# Patient Record
Sex: Female | Born: 1990 | Race: White | Hispanic: No | Marital: Single | Smoking: Current every day smoker
Health system: Southern US, Community
[De-identification: ages and names within clinical notes are randomized; demographics above are authoritative.]

## PROBLEM LIST (undated history)

## (undated) DIAGNOSIS — E119 Type 2 diabetes mellitus without complications: Secondary | ICD-10-CM

## (undated) DIAGNOSIS — R41840 Attention and concentration deficit: Secondary | ICD-10-CM

## (undated) HISTORY — PX: OTHER SURGICAL HISTORY: SHX169

## (undated) HISTORY — PX: ABDOMINAL HYSTERECTOMY: SHX81

---

## 2020-07-09 ENCOUNTER — Encounter (HOSPITAL_COMMUNITY): Payer: Self-pay

## 2020-07-09 ENCOUNTER — Emergency Department (HOSPITAL_COMMUNITY)
Admission: EM | Admit: 2020-07-09 | Discharge: 2020-07-10 | Disposition: A | Discharge status: Home / Self Care | Payer: Medicaid Other | Source: Home / Self Care | Attending: Emergency Medicine | Admitting: Emergency Medicine

## 2020-07-09 DIAGNOSIS — Z5321 Procedure and treatment not carried out due to patient leaving prior to being seen by health care provider: Secondary | ICD-10-CM | POA: Insufficient documentation

## 2020-07-09 DIAGNOSIS — E1010 Type 1 diabetes mellitus with ketoacidosis without coma: Secondary | ICD-10-CM | POA: Insufficient documentation

## 2020-07-09 HISTORY — DX: Type 2 diabetes mellitus without complications: E11.9

## 2020-07-09 LAB — CBC
HCT: 46.3 % — ABNORMAL HIGH (ref 36.0–46.0)
Hemoglobin: 16.5 g/dL — ABNORMAL HIGH (ref 12.0–15.0)
MCH: 31.7 pg (ref 26.0–34.0)
MCHC: 35.6 g/dL (ref 30.0–36.0)
MCV: 89 fL (ref 80.0–100.0)
Platelets: 581 10*3/uL — ABNORMAL HIGH (ref 150–400)
RBC: 5.2 MIL/uL — ABNORMAL HIGH (ref 3.87–5.11)
RDW: 12.3 % (ref 11.5–15.5)
WBC: 16.4 10*3/uL — ABNORMAL HIGH (ref 4.0–10.5)
nRBC: 0 % (ref 0.0–0.2)

## 2020-07-09 LAB — CBG MONITORING, ED: Glucose-Capillary: 241 mg/dL — ABNORMAL HIGH (ref 70–99)

## 2020-07-09 NOTE — ED Triage Notes (Signed)
Pt reports that she has not been taking her insulin, just because, now she believes she is in DKA and she started to take it again two days, n/v

## 2020-07-10 ENCOUNTER — Other Ambulatory Visit: Payer: Self-pay

## 2020-07-10 ENCOUNTER — Encounter (HOSPITAL_COMMUNITY): Payer: Self-pay

## 2020-07-10 ENCOUNTER — Inpatient Hospital Stay (HOSPITAL_COMMUNITY)
Admission: EM | Admit: 2020-07-10 | Discharge: 2020-07-12 | DRG: 638 | Disposition: A | Discharge status: Home / Self Care | Payer: Medicaid Other | Attending: Internal Medicine | Admitting: Internal Medicine

## 2020-07-10 ENCOUNTER — Encounter (HOSPITAL_COMMUNITY): Payer: Self-pay | Admitting: Emergency Medicine

## 2020-07-10 ENCOUNTER — Emergency Department (HOSPITAL_COMMUNITY): Payer: Medicaid Other

## 2020-07-10 DIAGNOSIS — E1310 Other specified diabetes mellitus with ketoacidosis without coma: Secondary | ICD-10-CM | POA: Diagnosis not present

## 2020-07-10 DIAGNOSIS — E860 Dehydration: Secondary | ICD-10-CM | POA: Diagnosis present

## 2020-07-10 DIAGNOSIS — R7989 Other specified abnormal findings of blood chemistry: Secondary | ICD-10-CM | POA: Diagnosis present

## 2020-07-10 DIAGNOSIS — E876 Hypokalemia: Secondary | ICD-10-CM | POA: Clinically undetermined

## 2020-07-10 DIAGNOSIS — Z79899 Other long term (current) drug therapy: Secondary | ICD-10-CM | POA: Diagnosis not present

## 2020-07-10 DIAGNOSIS — Z20822 Contact with and (suspected) exposure to covid-19: Secondary | ICD-10-CM | POA: Diagnosis present

## 2020-07-10 DIAGNOSIS — D72828 Other elevated white blood cell count: Secondary | ICD-10-CM | POA: Diagnosis present

## 2020-07-10 DIAGNOSIS — Z794 Long term (current) use of insulin: Secondary | ICD-10-CM | POA: Diagnosis not present

## 2020-07-10 DIAGNOSIS — R8281 Pyuria: Secondary | ICD-10-CM | POA: Diagnosis present

## 2020-07-10 DIAGNOSIS — Z91048 Other nonmedicinal substance allergy status: Secondary | ICD-10-CM

## 2020-07-10 DIAGNOSIS — Z9109 Other allergy status, other than to drugs and biological substances: Secondary | ICD-10-CM

## 2020-07-10 DIAGNOSIS — N179 Acute kidney failure, unspecified: Secondary | ICD-10-CM | POA: Diagnosis present

## 2020-07-10 DIAGNOSIS — E1010 Type 1 diabetes mellitus with ketoacidosis without coma: Principal | ICD-10-CM | POA: Diagnosis present

## 2020-07-10 DIAGNOSIS — R000 Tachycardia, unspecified: Secondary | ICD-10-CM | POA: Diagnosis present

## 2020-07-10 DIAGNOSIS — Z9119 Patient's noncompliance with other medical treatment and regimen: Secondary | ICD-10-CM | POA: Diagnosis not present

## 2020-07-10 DIAGNOSIS — D72829 Elevated white blood cell count, unspecified: Secondary | ICD-10-CM | POA: Diagnosis not present

## 2020-07-10 HISTORY — DX: Attention and concentration deficit: R41.840

## 2020-07-10 LAB — COMPREHENSIVE METABOLIC PANEL
ALT: 22 U/L (ref 0–44)
AST: 28 U/L (ref 15–41)
Albumin: 5.1 g/dL — ABNORMAL HIGH (ref 3.5–5.0)
Alkaline Phosphatase: 72 U/L (ref 38–126)
Anion gap: 20 — ABNORMAL HIGH (ref 5–15)
BUN: 38 mg/dL — ABNORMAL HIGH (ref 6–20)
CO2: 23 mmol/L (ref 22–32)
Calcium: 10 mg/dL (ref 8.9–10.3)
Chloride: 100 mmol/L (ref 98–111)
Creatinine, Ser: 1.07 mg/dL — ABNORMAL HIGH (ref 0.44–1.00)
GFR, Estimated: >60 mL/min (ref >60)
Glucose, Bld: 72 mg/dL (ref 70–99)
Potassium: 3.4 mmol/L — ABNORMAL LOW (ref 3.5–5.1)
Sodium: 143 mmol/L (ref 135–145)
Total Bilirubin: 1.3 mg/dL — ABNORMAL HIGH (ref 0.3–1.2)
Total Protein: 9.2 g/dL — ABNORMAL HIGH (ref 6.5–8.1)

## 2020-07-10 LAB — BASIC METABOLIC PANEL
Anion gap: 22 — ABNORMAL HIGH (ref 5–15)
BUN: 26 mg/dL — ABNORMAL HIGH (ref 6–20)
CO2: 17 mmol/L — ABNORMAL LOW (ref 22–32)
Calcium: 10 mg/dL (ref 8.9–10.3)
Chloride: 100 mmol/L (ref 98–111)
Creatinine, Ser: 1.25 mg/dL — ABNORMAL HIGH (ref 0.44–1.00)
GFR, Estimated: 59 mL/min — ABNORMAL LOW (ref >60)
Glucose, Bld: 230 mg/dL — ABNORMAL HIGH (ref 70–99)
Potassium: 3.6 mmol/L (ref 3.5–5.1)
Sodium: 139 mmol/L (ref 135–145)

## 2020-07-10 LAB — URINALYSIS, ROUTINE W REFLEX MICROSCOPIC
Bilirubin Urine: NEGATIVE
Glucose, UA: NEGATIVE mg/dL
Ketones, ur: 80 mg/dL — AB
Leukocytes,Ua: NEGATIVE
Nitrite: NEGATIVE
Protein, ur: 100 mg/dL — AB
Specific Gravity, Urine: 1.019 (ref 1.005–1.030)
WBC, UA: >50 WBC/hpf — ABNORMAL HIGH (ref 0–5)
pH: 6 (ref 5.0–8.0)

## 2020-07-10 LAB — CBC WITH DIFFERENTIAL/PLATELET
Abs Immature Granulocytes: 0.08 10*3/uL — ABNORMAL HIGH (ref 0.00–0.07)
Basophils Absolute: 0 10*3/uL (ref 0.0–0.1)
Basophils Relative: 0 %
Eosinophils Absolute: 0 10*3/uL (ref 0.0–0.5)
Eosinophils Relative: 0 %
HCT: 47.4 % — ABNORMAL HIGH (ref 36.0–46.0)
Hemoglobin: 17.4 g/dL — ABNORMAL HIGH (ref 12.0–15.0)
Immature Granulocytes: 0 %
Lymphocytes Relative: 8 %
Lymphs Abs: 1.7 10*3/uL (ref 0.7–4.0)
MCH: 31.9 pg (ref 26.0–34.0)
MCHC: 36.7 g/dL — ABNORMAL HIGH (ref 30.0–36.0)
MCV: 87 fL (ref 80.0–100.0)
Monocytes Absolute: 1.6 10*3/uL — ABNORMAL HIGH (ref 0.1–1.0)
Monocytes Relative: 8 %
Neutro Abs: 17.5 10*3/uL — ABNORMAL HIGH (ref 1.7–7.7)
Neutrophils Relative %: 84 %
Platelets: 501 10*3/uL — ABNORMAL HIGH (ref 150–400)
RBC: 5.45 MIL/uL — ABNORMAL HIGH (ref 3.87–5.11)
RDW: 12.3 % (ref 11.5–15.5)
WBC: 20.9 10*3/uL — ABNORMAL HIGH (ref 4.0–10.5)
nRBC: 0 % (ref 0.0–0.2)

## 2020-07-10 LAB — RESPIRATORY PANEL BY RT PCR (FLU A&B, COVID)
Influenza A by PCR: NEGATIVE
Influenza B by PCR: NEGATIVE
SARS Coronavirus 2 by RT PCR: NEGATIVE

## 2020-07-10 LAB — BLOOD GAS, VENOUS
Acid-Base Excess: 4.6 mmol/L — ABNORMAL HIGH (ref 0.0–2.0)
Bicarbonate: 26.9 mmol/L (ref 20.0–28.0)
O2 Saturation: 95.5 %
Patient temperature: 98.6
pCO2, Ven: 33.7 mmHg — ABNORMAL LOW (ref 44.0–60.0)
pH, Ven: 7.513 — ABNORMAL HIGH (ref 7.250–7.430)
pO2, Ven: 71.1 mmHg — ABNORMAL HIGH (ref 32.0–45.0)

## 2020-07-10 LAB — I-STAT BETA HCG BLOOD, ED (MC, WL, AP ONLY): I-stat hCG, quantitative: <5 m[IU]/mL (ref <5)

## 2020-07-10 LAB — CBG MONITORING, ED
Glucose-Capillary: 112 mg/dL — ABNORMAL HIGH (ref 70–99)
Glucose-Capillary: 125 mg/dL — ABNORMAL HIGH (ref 70–99)
Glucose-Capillary: 149 mg/dL — ABNORMAL HIGH (ref 70–99)
Glucose-Capillary: 87 mg/dL (ref 70–99)

## 2020-07-10 MED ORDER — LACTATED RINGERS IV SOLN: INTRAVENOUS | Status: DC

## 2020-07-10 MED ORDER — DEXTROSE IN LACTATED RINGERS 5 % IV SOLN: INTRAVENOUS | Status: DC

## 2020-07-10 MED ORDER — DEXTROSE 50 % IV SOLN: 0.0000 mL | INTRAVENOUS | Status: DC | PRN

## 2020-07-10 MED ORDER — LACTATED RINGERS IV BOLUS: 20.0000 mL/kg | Freq: Once | INTRAVENOUS | Status: DC

## 2020-07-10 MED ORDER — ONDANSETRON HCL 4 MG/2ML IJ SOLN
4.0000 mg | Freq: Once | INTRAMUSCULAR | Status: AC
  Administered 2020-07-10: 4 mg via INTRAVENOUS
  Filled 2020-07-10: qty 2

## 2020-07-10 MED ORDER — ENOXAPARIN SODIUM 40 MG/0.4ML ~~LOC~~ SOLN
40.0000 mg | SUBCUTANEOUS | Status: DC
  Filled 2020-07-10: qty 0.4

## 2020-07-10 MED ORDER — LACTATED RINGERS IV BOLUS
1000.0000 mL | Freq: Once | INTRAVENOUS | Status: AC
  Administered 2020-07-10: 1000 mL via INTRAVENOUS

## 2020-07-10 MED ORDER — INSULIN REGULAR(HUMAN) IN NACL 100-0.9 UT/100ML-% IV SOLN
INTRAVENOUS | Status: DC
  Administered 2020-07-11: 0.9 [IU]/h via INTRAVENOUS
  Filled 2020-07-10: qty 100

## 2020-07-10 MED ORDER — INSULIN REGULAR(HUMAN) IN NACL 100-0.9 UT/100ML-% IV SOLN: INTRAVENOUS | Status: DC

## 2020-07-10 MED ORDER — LORAZEPAM 2 MG/ML IJ SOLN
1.0000 mg | Freq: Once | INTRAMUSCULAR | Status: AC
  Administered 2020-07-10: 1 mg via INTRAVENOUS
  Filled 2020-07-10: qty 1

## 2020-07-10 MED ORDER — LACTATED RINGERS IV BOLUS
20.0000 mL/kg | Freq: Once | INTRAVENOUS | Status: AC
  Administered 2020-07-10: 1440 mL via INTRAVENOUS

## 2020-07-10 MED ORDER — POTASSIUM CHLORIDE 10 MEQ/100ML IV SOLN
10.0000 meq | INTRAVENOUS | Status: AC
  Administered 2020-07-11 (×3): 10 meq via INTRAVENOUS
  Filled 2020-07-10 (×4): qty 100

## 2020-07-10 MED ORDER — ONDANSETRON HCL 4 MG/2ML IJ SOLN: 4.0000 mg | Freq: Once | INTRAMUSCULAR | Status: DC

## 2020-07-10 MED ORDER — SODIUM CHLORIDE 0.9 % IV BOLUS
1000.0000 mL | Freq: Once | INTRAVENOUS | Status: AC
  Administered 2020-07-10: 1000 mL via INTRAVENOUS

## 2020-07-10 NOTE — ED Notes (Signed)
NAX3

## 2020-07-10 NOTE — H&P (Addendum)
History and Physical    Emily Acevedo:[id number] DOB: 07/31/1991 DOA: 07/10/2020  PCP: Patient, No Pcp Per  Patient coming from: Home  I have personally briefly reviewed patient's old medical records in [facility]  Chief Complaint: DM, ?DKA  HPI: Emily Acevedo is a 29 y.o. female with medical history significant of DM1, hysterectomy.  Pt presents to the ED via EMS with concern for DKA.  Pt has been out of diabetic test strips for past 1 year, not monitoring BGLs at home.  Over past 3 days she has been feeling unwell with nausea and SOB.  Feels like DKA.  Treating symptoms with 5-10u of insulin per hour for the past 2 days she says.  Administered self 15u just prior to calling EMS this afternoon.  On EMS arrival CBG 160, was 126 on recheck in ambulance.  Usually takes 1u per 5 carbs.  Has endocrinologist first appointment next month.  Denies fevers, chills, dysuria, foot ulcers, abd pain.   ED Course: Pt does have mild DKA with AG of 20 and 80 keytones in urine.  PH not acidotic however.  Bicarb 23.  Has WBC 20.9k.  Has very mild AKI / pre-renal azotemia with BUN 38 and creat 1.0.  HGB 17.4 (suspect hemoconcentration).  K 3.4.  BGL 72.  UA with 80 keytones, >50 WBC, no LE nor nitrites.   Review of Systems: As per HPI, otherwise all review of systems negative.  Past Medical History:  Diagnosis Date  . Diabetes mellitus without complication [facility])     Past Surgical History:  Procedure Laterality Date  . ABDOMINAL HYSTERECTOMY       reports that she has been smoking e-cigarettes. She does not have any smokeless tobacco history on file. She reports that she does not drink alcohol and does not use drugs.  Allergies  Allergen Reactions  . Other     Pt states allergy to base metals, sever rash  . Tape     Pt states "it tears my skin open"  . Nickel Rash    Family History  Problem Relation Age of Onset  . Diabetes Neg Hx      Prior to Admission  medications   Medication Sig Start Date End Date Taking? Authorizing Provider  gabapentin (NEURONTIN) 300 MG capsule Take 300 mg by mouth daily. 07/03/20  Yes [provider]  insulin aspart (NOVOLOG) 100 UNIT/ML FlexPen Inject 1 Units into the skin See admin instructions. Inject 1 unit per every carb intake. 08/24/19  Yes [provider]  insulin glargine (LANTUS SOLOSTAR) 100 UNIT/ML Solostar Pen Inject 38 Units into the skin daily. 12/16/17  Yes [provider]  ondansetron (ZOFRAN-ODT) 4 MG disintegrating tablet Take 4 mg by mouth every 8 (eight) hours as needed for nausea or vomiting.  08/24/19  Yes [provider]    Physical Exam: Vitals:   07/10/20 2115 07/10/20 2200 07/10/20 2227 07/10/20 2324  BP:  (!) 152/93  (!) 139/92  Pulse:    (!) 119  Resp: 17 18  (!) 21  Temp:   98 F (36.7 C) 98.8 F (37.1 C)  TempSrc:    Oral  SpO2:    95%  Weight:      Height:        Constitutional: NAD, ill appearing Eyes: PERRL, lids and conjunctivae normal ENMT: Mucous membranes are moist. Posterior pharynx clear of any exudate or lesions.Normal dentition.  Neck: normal, supple, no masses, no thyromegaly Respiratory: clear  to auscultation bilaterally, no wheezing, no crackles. Normal respiratory effort. No accessory muscle use.  Cardiovascular: Tachycardic Abdomen: no tenderness, no masses palpated. No hepatosplenomegaly. Bowel sounds positive.  Musculoskeletal: no clubbing / cyanosis. No joint deformity upper and lower extremities. Good ROM, no contractures. Normal muscle tone.  Skin: no rashes, lesions, ulcers. No induration Neurologic: CN 2-12 grossly intact. Sensation intact, DTR normal. Strength 5/5 in all 4.  Psychiatric: Normal judgment and insight. Alert and oriented x 3. Normal mood.    Labs on Admission: I have personally reviewed following labs and imaging studies  CBC: Recent Labs  Lab 07/09/20 2334 07/10/20 1540  WBC 16.4* 20.9*    NEUTROABS  --  17.5*  HGB 16.5* 17.4*  HCT 46.3* 47.4*  MCV 89.0 87.0  PLT 581* 501*   Basic Metabolic Panel: Recent Labs  Lab 07/09/20 2334 07/10/20 1540  NA 139 143  K 3.6 3.4*  CL 100 100  CO2 17* 23  GLUCOSE 230* 72  BUN 26* 38*  CREATININE 1.25* 1.07*  CALCIUM 10.0 10.0   GFR: Estimated Creatinine Clearance: 77.6 mL/min (A) (by C-G formula based on SCr of 1.07 mg/dL (H)). Liver Function Tests: Recent Labs  Lab 07/10/20 1540  AST 28  ALT 22  ALKPHOS 72  BILITOT 1.3*  PROT 9.2*  ALBUMIN 5.1*   No results for input(s): LIPASE, AMYLASE in the last 168 hours. No results for input(s): AMMONIA in the last 168 hours. Coagulation Profile: No results for input(s): INR, PROTIME in the last 168 hours. Cardiac Enzymes: No results for input(s): CKTOTAL, CKMB, CKMBINDEX, TROPONINI in the last 168 hours. BNP (last 3 results) No results for input(s): PROBNP in the last 8760 hours. HbA1C: No results for input(s): HGBA1C in the last 72 hours. CBG: Recent Labs  Lab 07/09/20 2307 07/10/20 1537 07/10/20 1622 07/10/20 1748  GLUCAP 241* 87 149* 112*   Lipid Profile: No results for input(s): CHOL, HDL, LDLCALC, TRIG, CHOLHDL, LDLDIRECT in the last 72 hours. Thyroid Function Tests: No results for input(s): TSH, T4TOTAL, FREET4, T3FREE, THYROIDAB in the last 72 hours. Anemia Panel: No results for input(s): VITAMINB12, FOLATE, FERRITIN, TIBC, IRON, RETICCTPCT in the last 72 hours. Urine analysis:    Component Value Date/Time   COLORURINE YELLOW 07/10/2020 2158   APPEARANCEUR CLOUDY (A) 07/10/2020 2158   LABSPEC 1.019 07/10/2020 2158   PHURINE 6.0 07/10/2020 2158   GLUCOSEU NEGATIVE 07/10/2020 2158   HGBUR SMALL (A) 07/10/2020 2158   BILIRUBINUR NEGATIVE 07/10/2020 2158   KETONESUR 80 (A) 07/10/2020 2158   PROTEINUR 100 (A) 07/10/2020 2158   NITRITE NEGATIVE 07/10/2020 2158   LEUKOCYTESUR NEGATIVE 07/10/2020 2158    Radiological Exams on Admission: DG Chest Port 1  View  Result Date: 07/10/2020 CLINICAL DATA:  Leukocytosis EXAM: PORTABLE CHEST 1 VIEW COMPARISON:  None. FINDINGS: The heart size and mediastinal contours are within normal limits. Both lungs are clear. The visualized skeletal structures are unremarkable. IMPRESSION: No active disease. Electronically Signed   By: Jasmine Pang M.D.   On: 07/10/2020 23:25    EKG: Independently reviewed.  Assessment/Plan Principal Problem:   DKA, type 1 (HCC) Active Problems:   Leukocytosis   Pyuria    1. DKA type 1 - 1. DKA pathway 2. Insulin gtt 3. IVF bolus and continuous per pathway 4. BGL not very elevated thanks to her taking insulin at home, but now needs glucose + insulin to feed cells until ketosis resolves. 5. BMP Q4H 2. Leukocytosis - 1. Possibly just stress  related demargination?  No obvious source or symptoms of infection at this point. 2. Repeat CBC in AM 3. See if tachycardia resolves with IVF bolus 3. Pyuria - 1. No dysuria symptoms, no leukocyte esterase 2. ?Mild ATN? 3. Check UCx  DVT prophylaxis: Lovenox Code Status: Full Family Communication: No family in room Disposition Plan: Home after DKA resolved Consults called: None Admission status: Admit to inpatient  Severity of Illness: The appropriate patient status for this patient is INPATIENT. Inpatient status is judged to be reasonable and necessary in order to provide the required intensity of service to ensure the patient's safety. The patient's presenting symptoms, physical exam findings, and initial radiographic and laboratory data in the context of their chronic comorbidities is felt to place them at high risk for further clinical deterioration. Furthermore, it is not anticipated that the patient will be medically stable for discharge from the hospital within 2 midnights of admission. The following factors support the patient status of inpatient.   IP status for treatment of DKA.   * I certify that at the point of  admission it is my clinical judgment that the patient will require inpatient hospital care spanning beyond 2 midnights from the point of admission due to high intensity of service, high risk for further deterioration and high frequency of surveillance required.*    September Mormile M. DO [facility]  How to contact the [facility] Attending or Consulting provider 7A - 7P or covering provider during after hours 7P -7A, for this patient?  1. Check the care team in [facility] and look for a) attending/consulting [facility] provider listed and b) the [facility] team listed 2. Log into [email or web address]  Amion Physician Scheduling and messaging for groups and whole hospitals  On call and physician scheduling software for group practices, residents, hospitalists and other medical providers for call, clinic, rotation and shift schedules. OnCall [employer] is a hospital-wide system for scheduling doctors and paging doctors on call. EasyPlot is for scientific plotting and data analysis.  [email or web address]  and use [place]'s universal password to access. If you do not have the password, please contact the hospital operator.  3. Locate the [facility] provider you are looking for under [facility] and page to a number that you can be directly reached. 4. If you still have difficulty reaching the provider, please page the [facility] (Director on Call) for the Hospitalists listed on amion for assistance.  07/10/2020, 11:39 PM

## 2020-07-10 NOTE — ED Notes (Addendum)
Pt placed on purewick at . Pt requests juice at this time. Pt notes she is allergic to adhesive. Pt is using the pulseox from fast track 6.

## 2020-07-10 NOTE — ED Provider Notes (Signed)
Emily Acevedo Provider Note   CSN: [id number] Arrival date & time: 07/10/20  1505     History Chief Complaint  Patient presents with  . Diabetes    Emily Acevedo is a 29 y.o. female w PMHx T1DM, hysterectomy, presenting to the ED via EMS with concern for DKA.  Patient states she is a type I diabetic, has been out of test strips for 1 year and has not been monitoring her blood sugars.  She states over the last 3 days she has been feeling unwell with nausea and shortness of breath.  She feels as though she is in DKA.  She states she has been treating her symptoms with anywhere from 5 to 10 units of insulin near every hour for the last 2 days.  She states she administered 15 units just prior to calling EMS this afternoon.  On EMS arrival to her house CBG was 160.  On recheck in the ambulance CBG was 126.  Denies fevers or chills, denies abdominal pain, cough, or other associated symptoms.  Patient states she treats her symptoms with insulin, takes anywhere from 5 to 10 units depending on how she feels.  She also takes 1 unit for every 5 carbs.  She has established care with an endocrinologist, her first appointment is next month.  She does not have a PCP.  The history is provided by the patient.       Past Medical History:  Diagnosis Date  . Diabetes mellitus without complication [facility])     Patient Active Problem List   Diagnosis Date Noted  . DKA, type 1 (HCC) 07/10/2020    Past Surgical History:  Procedure Laterality Date  . ABDOMINAL HYSTERECTOMY       OB History   No obstetric history on file.     History reviewed. No pertinent family history.  Social History   Tobacco Use  . Smoking status: Not on file  Substance Use Topics  . Alcohol use: Not on file  . Drug use: Not on file    Home Medications Prior to Admission medications   Medication Sig Start Date End Date Taking? Authorizing Provider  gabapentin (NEURONTIN) 300 MG  capsule Take 300 mg by mouth daily. 07/03/20  Yes [provider]  insulin aspart (NOVOLOG) 100 UNIT/ML FlexPen Inject 1 Units into the skin See admin instructions. Inject 1 unit per every carb intake. 08/24/19  Yes [provider]  insulin glargine (LANTUS SOLOSTAR) 100 UNIT/ML Solostar Pen Inject 38 Units into the skin daily. 12/16/17  Yes [provider]  ondansetron (ZOFRAN-ODT) 4 MG disintegrating tablet Take 4 mg by mouth every 8 (eight) hours as needed for nausea or vomiting.  08/24/19  Yes [provider]    Allergies    Other, Tape, and Nickel  Review of Systems   Review of Systems  All other systems reviewed and are negative.   Physical Exam Updated Vital Signs BP (!) 139/92 (BP Location: Left Arm)   Pulse (!) 119   Temp 98.8 F (37.1 C) (Oral)   Resp (!) 21   Ht 5\' 8"  (1.727 m)   Wt 72 kg   SpO2 95%   BMI 24.14 kg/m   Physical Exam Vitals and nursing note reviewed.  Constitutional:      Appearance: She is well-developed. She is ill-appearing.  HENT:     Head: Normocephalic and atraumatic.  Eyes:     Conjunctiva/sclera: Conjunctivae normal.  Cardiovascular:  Rate and Rhythm: Regular rhythm. Tachycardia present.  Pulmonary:     Effort: Pulmonary effort is normal. No respiratory distress.     Breath sounds: Normal breath sounds.     Comments: Normal work of breathing, no tachypnea Abdominal:     General: Bowel sounds are normal.     Palpations: Abdomen is soft.     Tenderness: There is no abdominal tenderness.  Skin:    General: Skin is warm.  Neurological:     Mental Status: She is alert.  Psychiatric:        Behavior: Behavior normal.     ED Results / Procedures / Treatments   Labs (all labs ordered are listed, but only abnormal results are displayed) Labs Reviewed  CBC WITH DIFFERENTIAL/PLATELET - Abnormal; Notable for the following components:      Result Value   WBC 20.9 (*)    RBC 5.45 (*)    Hemoglobin  17.4 (*)    HCT 47.4 (*)    MCHC 36.7 (*)    Platelets 501 (*)    Neutro Abs 17.5 (*)    Monocytes Absolute 1.6 (*)    Abs Immature Granulocytes 0.08 (*)    All other components within normal limits  COMPREHENSIVE METABOLIC PANEL - Abnormal; Notable for the following components:   Potassium 3.4 (*)    BUN 38 (*)    Creatinine, Ser 1.07 (*)    Total Protein 9.2 (*)    Albumin 5.1 (*)    Total Bilirubin 1.3 (*)    Anion gap 20 (*)    All other components within normal limits  URINALYSIS, ROUTINE W REFLEX MICROSCOPIC - Abnormal; Notable for the following components:   APPearance CLOUDY (*)    Hgb urine dipstick SMALL (*)    Ketones, ur 80 (*)    Protein, ur 100 (*)    WBC, UA >50 (*)    Bacteria, UA MANY (*)    All other components within normal limits  BLOOD GAS, VENOUS - Abnormal; Notable for the following components:   pH, Ven 7.513 (*)    pCO2, Ven 33.7 (*)    pO2, Ven 71.1 (*)    Acid-Base Excess 4.6 (*)    All other components within normal limits  CBG MONITORING, ED - Abnormal; Notable for the following components:   Glucose-Capillary 149 (*)    All other components within normal limits  CBG MONITORING, ED - Abnormal; Notable for the following components:   Glucose-Capillary 112 (*)    All other components within normal limits  RESPIRATORY PANEL BY RT PCR (FLU A&B, COVID)  HIV ANTIBODY (ROUTINE TESTING W REFLEX)  BASIC METABOLIC PANEL  BASIC METABOLIC PANEL  BASIC METABOLIC PANEL  BASIC METABOLIC PANEL  BETA-HYDROXYBUTYRIC ACID  BETA-HYDROXYBUTYRIC ACID  HEMOGLOBIN A1C  CBG MONITORING, ED  I-STAT VENOUS BLOOD GAS, ED    EKG EKG Interpretation  Date/Time:  Thursday July 10 2020 18:31:02 EDT Ventricular Rate:  119 PR Interval:    QRS Duration: 86 QT Interval:  291 QTC Calculation: 410 R Axis:   71 Text Interpretation: Sinus tachycardia Borderline repolarization abnormality since last tracing no significant change Confirmed by Mancel Bale [phone]) on  07/10/2020 6:36:49 PM   Radiology DG Chest Port 1 View  Result Date: 07/10/2020 CLINICAL DATA:  Leukocytosis EXAM: PORTABLE CHEST 1 VIEW COMPARISON:  None. FINDINGS: The heart size and mediastinal contours are within normal limits. Both lungs are clear. The visualized skeletal structures are unremarkable. IMPRESSION: No active disease. Electronically  Signed   By: Jasmine Pang M.D.   On: 07/10/2020 23:25    Procedures .Critical Care Performed by: Katiejo Gilroy, [place] N, PA-C Authorized by: Rubi Tooley, [place] N, PA-C   Critical care provider statement:    Critical care time (minutes):  45   Critical care time was exclusive of:  Separately billable procedures and treating other patients and teaching time   Critical care was necessary to treat or prevent imminent or life-threatening deterioration of the following conditions:  Metabolic crisis   Critical care was time spent personally by me on the following activities:  Discussions with consultants, evaluation of patient's response to treatment, examination of patient, ordering and performing treatments and interventions, ordering and review of laboratory studies, ordering and review of radiographic studies, pulse oximetry, re-evaluation of patient's condition, obtaining history from patient or surrogate and review of old charts   I assumed direction of critical care for this patient from another provider in my specialty: no     (including critical care time)  Medications Ordered in ED Medications  enoxaparin (LOVENOX) injection 40 mg (has no administration in time range)  lactated ringers bolus 1,440 mL (has no administration in time range)  insulin regular, human (MYXREDLIN) 100 units/ 100 mL infusion (has no administration in time range)  lactated ringers infusion (has no administration in time range)  dextrose 5 % in lactated ringers infusion (has no administration in time range)  dextrose 50 % solution 0-50 mL (has no administration in time  range)  potassium chloride 10 mEq in 100 mL IVPB (has no administration in time range)  sodium chloride 0.9 % bolus 1,000 mL (0 mLs Intravenous Stopped 07/10/20 1759)  lactated ringers bolus 1,000 mL (0 mLs Intravenous Stopped 07/10/20 2307)  ondansetron (ZOFRAN) injection 4 mg (4 mg Intravenous Given 07/10/20 2029)  LORazepam (ATIVAN) injection 1 mg (1 mg Intravenous Given 07/10/20 2226)    ED Course  I have reviewed the triage vital signs and the nursing notes.  Pertinent labs & imaging results that were available during my care of the patient were reviewed by me and considered in my medical decision making (see chart for details).  Clinical Course as of Jul 10 2333  Thu Jul 10, 2020  2015 Patient reevaluated, reports nausea is returning.  Discussed laboratory work-up thus far, plan for VBG.  We will continue IV hydration.   [JR]  2159 Patient reevaluated once more, reports nausea is not improved.  She does endorse daily marijuana use to help stimulate appetite.  She denies urinary symptoms, will attempt to provide urine sample at bedside.  She states she feels too weak to ambulate to the restroom.   [JR]    Clinical Course User Index [JR] Kalyssa Anker, [place] N, PA-C   MDM Rules/Calculators/A&P                          Pt with T1DM, noncompliant with management over the last year, presenting with symptoms of DKA over the last few days. She endorses nausea and generalized weakness. She has treated with insulin at home over the last 2 days, presented yesterday and had labs drawn but left after triage without being seen. Labs yesterday show mild DKA. She treated with 15u insulin today just prior to arrival.   On arrival, patient is ill-appearing, vital signs without fever though she is tachycardic slightly hypertensive.  CBG is 87 on arrival, down to 100 from 160 per EMS.  Labs ordered, IV  fluids.  Metabolic panel with collated BUN of 38, creatinine 1.1.  Bicarb is 23 with an elevated gap  of 20.  He is also noted to have a leukocytosis of 20.9, up from yesterday with elevated hemoglobin.  Findings are consistent with dehydration, however given patient's history and presentation, VBG ordered to evaluate pH in consideration of compensated DKA.  pH shows alkalosis at 7.5, bicarb of 27, PCO2 34. Suspect some compensation since yesterday. U/A with ketones, contaminated specimen with 6-10 squamous - no symptoms of UTI, will hold on abx. Pt agreeable with this plan.  Patient without much symptoms improvement overall after multiple liters of IVF. She does feel some improvement in nausea after ativan, suspect this was difficult to manage due to daily marijuana use. Patient admitted for dehydration with diabetic ketosis in the setting of compensated DKA.   Final Clinical Impression(s) / ED Diagnoses Final diagnoses:  Dehydration  Diabetic ketosis [facility](HCC)    Rx / DC Orders ED Discharge Orders    None       Cheryn Lundquist, [place] N, PA-C 07/10/20 2334    Mancel BaleWentz, [place], MD 07/12/20 [phone]

## 2020-07-10 NOTE — ED Notes (Signed)
Pt tolerated of orange juice

## 2020-07-10 NOTE — ED Notes (Signed)
ED TO INPATIENT HANDOFF REPORT  ED Nurse Name and Phone #: Sharene Skeans [phone]  S Name/Age/Gender Emily Acevedo 29 y.o. female Room/Bed: WA21/WA21  Code Status   Code Status: Full Code  Home/SNF/Other Home Patient oriented to: self, place,time, situation Is this baseline? Yes   Triage Complete: Triage complete  Chief Complaint DKA, type 1 (HCC) [E10.10]  Triage Note Pt states hasn't been checking BS due to out of strips. EMS reports cbg-160 on arrival and cbg-126 in route. Patient self administered 15 units of novolog while calling ems. Pt reports n/v/abd pain/weakness/lightheaded. 4mg  of Zofran and NS admin with EMS    Allergies Allergies  Allergen Reactions  . Other     Pt states allergy to base metals, sever rash  . Tape     Pt states "it tears my skin open"  . Nickel Rash    Level of Care/Admitting Diagnosis ED Disposition    ED Disposition Condition Comment   Admit  Hospital Area: [facility] [[id number]]  Level of Care: Stepdown [14]  Admit to SDU based on following criteria: Severe physiological/psychological symptoms:  Any diagnosis requiring assessment & intervention at least every 4 hours on an ongoing basis to obtain desired patient outcomes including stability and rehabilitation  May admit patient to [facility] if equivalent level of care is available:: No  Covid Evaluation: Asymptomatic Screening Protocol (No Symptoms)  Diagnosis: DKA, type 1 [facility]) [facility]  Admitting Physician: [[id number]]  Attending Physician: Wyvonnia Dusky [phone]  Estimated length of stay: past midnight tomorrow  Certification:: I certify this patient will need inpatient services for at least 2 midnights       B Medical/Surgery History Past Medical History:  Diagnosis Date  . Diabetes mellitus without complication [facility])    Past Surgical History:  Procedure Laterality Date  . ABDOMINAL HYSTERECTOMY       A IV  Location/Drains/Wounds Patient Lines/Drains/Airways Status    Active Line/Drains/Airways    Name Placement date Placement time Site Days   Peripheral IV 07/10/20 Left Hand 07/10/20  2055  Hand  less than 1          Intake/Output Last 24 hours  Intake/Output Summary (Last 24 hours) at 07/10/2020 2324 Last data filed at 07/10/2020 1759 Gross per 24 hour  Intake 1000 ml  Output --  Net 1000 ml    Labs/Imaging Results for orders placed or performed during the hospital encounter of 07/10/20 (from the past 48 hour(s))  CBG monitoring, ED     Status: None   Collection Time: 07/10/20  3:37 PM  Result Value Ref Range   Glucose-Capillary 87 70 - 99 mg/dL    Comment: Glucose reference range applies only to samples taken after fasting for at least 8 hours.  CBC with Differential     Status: Abnormal   Collection Time: 07/10/20  3:40 PM  Result Value Ref Range   WBC 20.9 (H) 4.0 - 10.5 K/uL   RBC 5.45 (H) 3.87 - 5.11 MIL/uL   Hemoglobin 17.4 (H) 12.0 - 15.0 g/dL   HCT 07/12/20 (H) 36 - 46 %   MCV 87.0 80.0 - 100.0 fL   MCH 31.9 26.0 - 34.0 pg   MCHC 36.7 (H) 30.0 - 36.0 g/dL   RDW 73.2 20.2 - 54.2 %   Platelets 501 (H) 150 - 400 K/uL   nRBC 0.0 0.0 - 0.2 %   Neutrophils Relative % 84 %  Neutro Abs 17.5 (H) 1.7 - 7.7 K/uL   Lymphocytes Relative 8 %   Lymphs Abs 1.7 0.7 - 4.0 K/uL   Monocytes Relative 8 %   Monocytes Absolute 1.6 (H) 0.1 - 1.0 K/uL   Eosinophils Relative 0 %   Eosinophils Absolute 0.0 0.0 - 0.5 K/uL   Basophils Relative 0 %   Basophils Absolute 0.0 0.0 - 0.1 K/uL   Immature Granulocytes 0 %   Abs Immature Granulocytes 0.08 (H) 0.00 - 0.07 K/uL    Comment: Performed at [facility], [address] [place], [place] [address]  Comprehensive metabolic panel     Status: Abnormal   Collection Time: 07/10/20  3:40 PM  Result Value Ref Range   Sodium 143 135 - 145 mmol/L   Potassium 3.4 (L) 3.5 - 5.1 mmol/L   Chloride 100 98 - 111 mmol/L   CO2 23  22 - 32 mmol/L   Glucose, Bld 72 70 - 99 mg/dL    Comment: Glucose reference range applies only to samples taken after fasting for at least 8 hours.   BUN 38 (H) 6 - 20 mg/dL   Creatinine, Ser 8.111.07 (H) 0.44 - 1.00 mg/dL   Calcium 91.410.0 8.9 - 78.210.3 mg/dL   Total Protein 9.2 (H) 6.5 - 8.1 g/dL   Albumin 5.1 (H) 3.5 - 5.0 g/dL   AST 28 15 - 41 U/L   ALT 22 0 - 44 U/L   Alkaline Phosphatase 72 38 - 126 U/L   Total Bilirubin 1.3 (H) 0.3 - 1.2 mg/dL   GFR, Estimated >95>60 >62>60 mL/min    Comment: (NOTE) Calculated using the CKD-EPI Creatinine Equation (2021)    Anion gap 20 (H) 5 - 15    Comment: Performed at [facility], [address] [place], [place] [address]  CBG monitoring, ED     Status: Abnormal   Collection Time: 07/10/20  4:22 PM  Result Value Ref Range   Glucose-Capillary 149 (H) 70 - 99 mg/dL    Comment: Glucose reference range applies only to samples taken after fasting for at least 8 hours.  CBG monitoring, ED     Status: Abnormal   Collection Time: 07/10/20  5:48 PM  Result Value Ref Range   Glucose-Capillary 112 (H) 70 - 99 mg/dL    Comment: Glucose reference range applies only to samples taken after fasting for at least 8 hours.  Blood gas, venous     Status: Abnormal   Collection Time: 07/10/20  8:50 PM  Result Value Ref Range   pH, Ven 7.513 (H) 7.25 - 7.43   pCO2, Ven 33.7 (L) 44 - 60 mmHg   pO2, Ven 71.1 (H) 32 - 45 mmHg   Bicarbonate 26.9 20.0 - 28.0 mmol/L   Acid-Base Excess 4.6 (H) 0.0 - 2.0 mmol/L   O2 Saturation 95.5 %   Patient temperature 98.6     Comment: Performed at [facility], [address] [place], [place] [address]  Respiratory Panel by RT PCR (Flu A&B, Covid) - Nasopharyngeal Swab     Status: None   Collection Time: 07/10/20  9:53 PM   Specimen: Nasopharyngeal Swab  Result Value Ref Range   SARS Coronavirus 2 by RT PCR NEGATIVE NEGATIVE    Comment: (NOTE) SARS-CoV-2 target nucleic acids are NOT  DETECTED.  The SARS-CoV-2 RNA is generally detectable in upper respiratoy specimens during the acute phase of infection. The lowest concentration of SARS-CoV-2 viral copies this  assay can detect is 131 copies/mL. A negative result does not preclude SARS-Cov-2 infection and should not be used as the sole basis for treatment or other patient management decisions. A negative result may occur with  improper specimen collection/handling, submission of specimen other than nasopharyngeal swab, presence of viral mutation(s) within the areas targeted by this assay, and inadequate number of viral copies (<131 copies/mL). A negative result must be combined with clinical observations, patient history, and epidemiological information. The expected result is Negative.  Fact Sheet for Patients:  https://www.moore.com/  Fact Sheet for Healthcare Providers:  https://www.young.biz/  This test is no t yet approved or cleared by the [place] FDA and  has been authorized for detection and/or diagnosis of SARS-CoV-2 by FDA under an Emergency Use Authorization (EUA). This EUA will remain  in effect (meaning this test can be used) for the duration of the COVID-19 declaration under Section 564(b)(1) of the Act, 21 U.S.C. section 360bbb-3(b)(1), unless the authorization is terminated or revoked sooner.     Influenza A by PCR NEGATIVE NEGATIVE   Influenza B by PCR NEGATIVE NEGATIVE    Comment: (NOTE) The Xpert Xpress SARS-CoV-2/FLU/RSV assay is intended as an aid in  the diagnosis of influenza from Nasopharyngeal swab specimens and  should not be used as a sole basis for treatment. Nasal washings and  aspirates are unacceptable for Xpert Xpress SARS-CoV-2/FLU/RSV  testing.  Fact Sheet for Patients: https://www.moore.com/  Fact Sheet for Healthcare Providers: https://www.young.biz/  This test is not yet approved or  cleared by the [place] FDA and  has been authorized for detection and/or diagnosis of SARS-CoV-2 by  FDA under an Emergency Use Authorization (EUA). This EUA will remain  in effect (meaning this test can be used) for the duration of the  Covid-19 declaration under Section 564(b)(1) of the Act, 21  U.S.C. section 360bbb-3(b)(1), unless the authorization is  terminated or revoked. Performed at [facility], [address], [place], [place] [address]   Urinalysis, Routine w reflex microscopic Urine, Clean Catch     Status: Abnormal   Collection Time: 07/10/20  9:58 PM  Result Value Ref Range   Color, Urine YELLOW YELLOW   APPearance CLOUDY (A) CLEAR   Specific Gravity, Urine 1.019 1.005 - 1.030   pH 6.0 5.0 - 8.0   Glucose, UA NEGATIVE NEGATIVE mg/dL   Hgb urine dipstick SMALL (A) NEGATIVE   Bilirubin Urine NEGATIVE NEGATIVE   Ketones, ur 80 (A) NEGATIVE mg/dL   Protein, ur 627 (A) NEGATIVE mg/dL   Nitrite NEGATIVE NEGATIVE   Leukocytes,Ua NEGATIVE NEGATIVE   RBC / HPF 6-10 0 - 5 RBC/hpf   WBC, UA >50 (H) 0 - 5 WBC/hpf   Bacteria, UA MANY (A) NONE SEEN   Squamous Epithelial / LPF 6-10 0 - 5   Mucus PRESENT    Hyaline Casts, UA PRESENT     Comment: Performed at [facility], [address] [place], [place] [address]   No results found.  Pending Labs Unresulted Labs (From admission, onward)          Start     Ordered   07/10/20 2305  HIV Antibody (routine testing w rflx)  (HIV Antibody (Routine testing w reflex) panel)  Once,   STAT        07/10/20 2306   07/10/20 2305  Basic metabolic panel  (Diabetes Ketoacidosis (DKA))  STAT Now then every 4 hours ,   STAT  07/10/20 2306   07/10/20 2305  Beta-hydroxybutyric acid  (Diabetes Ketoacidosis (DKA))  Now then every 8 hours,   R (with STAT occurrences)      07/10/20 2306   07/10/20 2305  Hemoglobin A1c  (Diabetes Ketoacidosis (DKA))  Once,   STAT       Comments: To assess prior  glycemic control.    07/10/20 2306          Vitals/Pain Today's Vitals   07/10/20 2100 07/10/20 2115 07/10/20 2200 07/10/20 2227  BP: (!) 142/93  (!) 152/93   Pulse:      Resp: 16 17 18    Temp:    98 F (36.7 C)  TempSrc:      SpO2:      Weight:      Height:        Isolation Precautions No active isolations  Medications Medications  enoxaparin (LOVENOX) injection 40 mg (has no administration in time range)  lactated ringers bolus 1,440 mL (has no administration in time range)  insulin regular, human (MYXREDLIN) 100 units/ 100 mL infusion (has no administration in time range)  lactated ringers infusion (has no administration in time range)  dextrose 5 % in lactated ringers infusion (has no administration in time range)  dextrose 50 % solution 0-50 mL (has no administration in time range)  potassium chloride 10 mEq in 100 mL IVPB (has no administration in time range)  sodium chloride 0.9 % bolus 1,000 mL (0 mLs Intravenous Stopped 07/10/20 1759)  lactated ringers bolus 1,000 mL (0 mLs Intravenous Stopped 07/10/20 2307)  ondansetron (ZOFRAN) injection 4 mg (4 mg Intravenous Given 07/10/20 2029)  LORazepam (ATIVAN) injection 1 mg (1 mg Intravenous Given 07/10/20 2226)    Mobility walks Low fall risk   Focused Assessments Renal Assessment Handoff:  R Recommendations: See Admitting Provider Note  Report given to:   Additional Notes:

## 2020-07-10 NOTE — ED Triage Notes (Signed)
Pt states hasn't been checking BS due to out of strips. EMS reports cbg-160 on arrival and cbg-126 in route. Patient self administered 15 units of novolog while calling ems. Pt reports n/v/abd pain/weakness/lightheaded. 4mg  of Zofran and NS admin with EMS

## 2020-07-10 NOTE — ED Notes (Addendum)
Unable to locate pt in lobby, called pt and advised pt to return, family states they waited an hour and a half and did not receive care so they went home and took home insulin and will not be returning.

## 2020-07-11 ENCOUNTER — Encounter (HOSPITAL_COMMUNITY): Payer: Self-pay | Admitting: Internal Medicine

## 2020-07-11 DIAGNOSIS — E876 Hypokalemia: Secondary | ICD-10-CM

## 2020-07-11 LAB — BASIC METABOLIC PANEL
Anion gap: 14 (ref 5–15)
Anion gap: 12 (ref 5–15)
Anion gap: 10 (ref 5–15)
Anion gap: 8 (ref 5–15)
BUN: 32 mg/dL — ABNORMAL HIGH (ref 6–20)
BUN: 27 mg/dL — ABNORMAL HIGH (ref 6–20)
BUN: 21 mg/dL — ABNORMAL HIGH (ref 6–20)
BUN: 16 mg/dL (ref 6–20)
CO2: 25 mmol/L (ref 22–32)
CO2: 28 mmol/L (ref 22–32)
CO2: 26 mmol/L (ref 22–32)
CO2: 26 mmol/L (ref 22–32)
Calcium: 9 mg/dL (ref 8.9–10.3)
Calcium: 9.1 mg/dL (ref 8.9–10.3)
Calcium: 8.6 mg/dL — ABNORMAL LOW (ref 8.9–10.3)
Calcium: 8.3 mg/dL — ABNORMAL LOW (ref 8.9–10.3)
Chloride: 100 mmol/L (ref 98–111)
Chloride: 101 mmol/L (ref 98–111)
Chloride: 99 mmol/L (ref 98–111)
Chloride: 106 mmol/L (ref 98–111)
Creatinine, Ser: 0.88 mg/dL (ref 0.44–1.00)
Creatinine, Ser: 0.77 mg/dL (ref 0.44–1.00)
Creatinine, Ser: 0.71 mg/dL (ref 0.44–1.00)
Creatinine, Ser: 0.59 mg/dL (ref 0.44–1.00)
GFR, Estimated: >60 mL/min (ref >60)
GFR, Estimated: >60 mL/min (ref >60)
GFR, Estimated: >60 mL/min (ref >60)
GFR, Estimated: >60 mL/min (ref >60)
Glucose, Bld: 154 mg/dL — ABNORMAL HIGH (ref 70–99)
Glucose, Bld: 163 mg/dL — ABNORMAL HIGH (ref 70–99)
Glucose, Bld: 155 mg/dL — ABNORMAL HIGH (ref 70–99)
Glucose, Bld: 64 mg/dL — ABNORMAL LOW (ref 70–99)
Potassium: 3 mmol/L — ABNORMAL LOW (ref 3.5–5.1)
Potassium: 3.1 mmol/L — ABNORMAL LOW (ref 3.5–5.1)
Potassium: 2.9 mmol/L — ABNORMAL LOW (ref 3.5–5.1)
Potassium: 3.4 mmol/L — ABNORMAL LOW (ref 3.5–5.1)
Sodium: 139 mmol/L (ref 135–145)
Sodium: 141 mmol/L (ref 135–145)
Sodium: 135 mmol/L (ref 135–145)
Sodium: 140 mmol/L (ref 135–145)

## 2020-07-11 LAB — GLUCOSE, CAPILLARY
Glucose-Capillary: 123 mg/dL — ABNORMAL HIGH (ref 70–99)
Glucose-Capillary: 130 mg/dL — ABNORMAL HIGH (ref 70–99)
Glucose-Capillary: 130 mg/dL — ABNORMAL HIGH (ref 70–99)
Glucose-Capillary: 150 mg/dL — ABNORMAL HIGH (ref 70–99)
Glucose-Capillary: 153 mg/dL — ABNORMAL HIGH (ref 70–99)
Glucose-Capillary: 153 mg/dL — ABNORMAL HIGH (ref 70–99)
Glucose-Capillary: 164 mg/dL — ABNORMAL HIGH (ref 70–99)
Glucose-Capillary: 169 mg/dL — ABNORMAL HIGH (ref 70–99)
Glucose-Capillary: 170 mg/dL — ABNORMAL HIGH (ref 70–99)
Glucose-Capillary: 172 mg/dL — ABNORMAL HIGH (ref 70–99)
Glucose-Capillary: 179 mg/dL — ABNORMAL HIGH (ref 70–99)
Glucose-Capillary: 184 mg/dL — ABNORMAL HIGH (ref 70–99)
Glucose-Capillary: 62 mg/dL — ABNORMAL LOW (ref 70–99)
Glucose-Capillary: 94 mg/dL (ref 70–99)

## 2020-07-11 LAB — CBC
HCT: 41.2 % (ref 36.0–46.0)
Hemoglobin: 14.7 g/dL (ref 12.0–15.0)
MCH: 32.6 pg (ref 26.0–34.0)
MCHC: 35.7 g/dL (ref 30.0–36.0)
MCV: 91.4 fL (ref 80.0–100.0)
Platelets: 387 10*3/uL (ref 150–400)
RBC: 4.51 MIL/uL (ref 3.87–5.11)
RDW: 12.6 % (ref 11.5–15.5)
WBC: 16.3 10*3/uL — ABNORMAL HIGH (ref 4.0–10.5)
nRBC: 0 % (ref 0.0–0.2)

## 2020-07-11 LAB — MRSA PCR SCREENING: MRSA by PCR: NEGATIVE

## 2020-07-11 LAB — MAGNESIUM: Magnesium: 1.8 mg/dL (ref 1.7–2.4)

## 2020-07-11 LAB — BETA-HYDROXYBUTYRIC ACID
Beta-Hydroxybutyric Acid: 3.37 mmol/L — ABNORMAL HIGH (ref 0.05–0.27)
Beta-Hydroxybutyric Acid: 0.38 mmol/L — ABNORMAL HIGH (ref 0.05–0.27)
Beta-Hydroxybutyric Acid: 0.48 mmol/L — ABNORMAL HIGH (ref 0.05–0.27)

## 2020-07-11 LAB — HIV ANTIBODY (ROUTINE TESTING W REFLEX): HIV Screen 4th Generation wRfx: NONREACTIVE

## 2020-07-11 MED ORDER — LABETALOL HCL 5 MG/ML IV SOLN
10.0000 mg | INTRAVENOUS | Status: DC | PRN
  Filled 2020-07-11: qty 4

## 2020-07-11 MED ORDER — INSULIN GLARGINE 100 UNIT/ML ~~LOC~~ SOLN
38.0000 [IU] | Freq: Every day | SUBCUTANEOUS | Status: DC
  Administered 2020-07-11 – 2020-07-12 (×2): 38 [IU] via SUBCUTANEOUS
  Filled 2020-07-11 (×2): qty 0.38

## 2020-07-11 MED ORDER — POTASSIUM CHLORIDE CRYS ER 20 MEQ PO TBCR
40.0000 meq | EXTENDED_RELEASE_TABLET | ORAL | Status: AC
  Administered 2020-07-11 (×2): 40 meq via ORAL
  Filled 2020-07-11 (×2): qty 2

## 2020-07-11 MED ORDER — MAGNESIUM SULFATE 2 GM/50ML IV SOLN
2.0000 g | Freq: Once | INTRAVENOUS | Status: AC
  Administered 2020-07-11: 2 g via INTRAVENOUS

## 2020-07-11 MED ORDER — SODIUM CHLORIDE 0.9 % IV SOLN: INTRAVENOUS | Status: DC

## 2020-07-11 MED ORDER — GABAPENTIN 300 MG PO CAPS
300.0000 mg | ORAL_CAPSULE | Freq: Every day | ORAL | Status: DC
  Administered 2020-07-12: 300 mg via ORAL
  Filled 2020-07-11: qty 1

## 2020-07-11 MED ORDER — POTASSIUM CHLORIDE CRYS ER 20 MEQ PO TBCR
40.0000 meq | EXTENDED_RELEASE_TABLET | Freq: Once | ORAL | Status: AC
  Administered 2020-07-11: 40 meq via ORAL
  Filled 2020-07-11: qty 2

## 2020-07-11 MED ORDER — INSULIN ASPART 100 UNIT/ML ~~LOC~~ SOLN
0.0000 [IU] | Freq: Three times a day (TID) | SUBCUTANEOUS | Status: DC
  Administered 2020-07-12: 1 [IU] via SUBCUTANEOUS
  Administered 2020-07-12: 2 [IU] via SUBCUTANEOUS

## 2020-07-11 MED ORDER — CHLORHEXIDINE GLUCONATE CLOTH 2 % EX PADS
6.0000 | MEDICATED_PAD | Freq: Every day | CUTANEOUS | Status: DC
  Administered 2020-07-11: 6 via TOPICAL

## 2020-07-11 MED ORDER — PROCHLORPERAZINE EDISYLATE 10 MG/2ML IJ SOLN
10.0000 mg | Freq: Four times a day (QID) | INTRAMUSCULAR | Status: DC | PRN
  Administered 2020-07-11 (×2): 10 mg via INTRAVENOUS
  Filled 2020-07-11 (×2): qty 2

## 2020-07-11 MED ORDER — INSULIN ASPART 100 UNIT/ML ~~LOC~~ SOLN
0.0000 [IU] | Freq: Three times a day (TID) | SUBCUTANEOUS | Status: DC
  Administered 2020-07-11: 3 [IU] via SUBCUTANEOUS

## 2020-07-11 MED ORDER — SODIUM CHLORIDE 0.9 % IV BOLUS
1000.0000 mL | Freq: Once | INTRAVENOUS | Status: AC
  Administered 2020-07-11: 1000 mL via INTRAVENOUS

## 2020-07-11 MED ORDER — ONDANSETRON HCL 4 MG/2ML IJ SOLN
4.0000 mg | Freq: Three times a day (TID) | INTRAMUSCULAR | Status: DC | PRN
  Filled 2020-07-11: qty 2

## 2020-07-11 NOTE — Progress Notes (Signed)
Hypoglycemic Event  CBG: 62  Treatment: 4 oz juice/soda  Symptoms: None  Follow-up CBG: Time:1658 CBG Result:94  Possible Reasons for Event: Inadequate meal intake  Emily Acevedo

## 2020-07-11 NOTE — Progress Notes (Signed)
Inpatient Diabetes Program Recommendations  AACE/ADA: New Consensus Statement on Inpatient Glycemic Control (2015)  Target Ranges:  Prepandial:   less than 140 mg/dL      Peak postprandial:   less than 180 mg/dL (1-2 hours)      Critically ill patients:  140 - 180 mg/dL   Lab Results  Component Value Date   GLUCAP 150 (H) 07/11/2020    Review of Glycemic Control  Diabetes history: DM1 Outpatient Diabetes medications: Lantus 38 units qd + Novolog meal coverage 1 unit per 5 gm carbohydrates Current orders for Inpatient glycemic control: IV insulin transitioning to Lantus 38 units + Novolog moderate correction tid  Inpatient Diabetes Program Recommendations:    Spoke with patient @ bedside and patient very sleepy but answers questions asked. Patient has not been checking CBG  Dr. Janee Morn aware patient needs prescription for test strips. Patient has Medicaid so will be covered if patient gets prescription.  When patient progresses diet, consider -Novolog 5 units tid meal coverage if eats 50% meal  Generic test strips   order [phone] Generic lancets order # [id number]  Thank you, Emily Acevedo. Jenaye Rickert, RN, MSN, CDE  Diabetes Coordinator Inpatient Glycemic Control Team Team Pager [phone] (8am-5pm) 07/11/2020 1:05 PM

## 2020-07-11 NOTE — ED Notes (Signed)
Report called to cynthia rn 

## 2020-07-11 NOTE — Progress Notes (Signed)
PROGRESS NOTE    Emily Acevedo  VOH:[id number] DOB: 04-Nov-1990 DOA: 07/10/2020 PCP: Patient, No Pcp Per    Chief Complaint  Patient presents with  . Diabetes    Brief Narrative:  HPI per Dr. Hazle Coca is a 29 y.o. female with medical history significant of DM1, hysterectomy.  Pt presented to the ED via EMS with concern for DKA.  Pt has been out of diabetic test strips for past 1 year, not monitoring BGLs at home.  Over past 3 days she has been feeling unwell with nausea and SOB.  Feels like DKA.  Treating symptoms with 5-10u of insulin per hour for the past 2 days she says.  Administered self 15u just prior to calling EMS this afternoon.  On EMS arrival CBG 160, was 126 on recheck in ambulance.  Usually takes 1u per 5 carbs.  Has endocrinologist first appointment next month.  Denies fevers, chills, dysuria, foot ulcers, abd pain.   ED Course: Pt does have mild DKA with AG of 20 and 80 keytones in urine.  PH not acidotic however.  Bicarb 23.  Has WBC 20.9k.  Has very mild AKI / pre-renal azotemia with BUN 38 and creat 1.0.  HGB 17.4 (suspect hemoconcentration).  K 3.4.  BGL 72.  UA with 80 keytones, >50 WBC, no LE nor nitrites.   Assessment & Plan:   Principal Problem:   DKA, type 1 (HCC) Active Problems:   Leukocytosis   Pyuria   Hypokalemia  1 DKA, type I diabetic Patient had presented with nausea, some shortness of breath, noted to be in DKA with urinalysis with 80 ketones, no leukocyte esterase or nitrites, many bacteria, greater than 50 WBC.  Noted to have an anion gap of 20.  Chest x-ray negative for any acute infectious process.  Patient noted to have a leukocytosis however is trending down.  EKG with no ischemic changes noted.  Hemoglobin A1c pending.  Patient noted not to be checking her blood glucose levels however does states she is compliant with her medications.  Patient placed on the Endo tool/glucose stabilizer with  clinical improvement.  Anion gap closed currently at 12.  Bicarb of 28.  Beta hydroxybutyrate trending down.  We will transition patient back to home regimen of Lantus 38 units daily and discontinue insulin drip 2 hours after Lantus has been given.  After Lantus drip has been discontinued change IV fluids from D5 normal saline to normal saline.  Placed on a clear liquid diet and advance as tolerated to a carb modified diet.  Supportive care.  Follow.  2.  Hypokalemia Secondary to problem #1.  K. Dur 40 mEq p.o. every 4 hours x2 doses.  Magnesium at 1.8.  We will give magnesium sulfate 2 g IV x1.  3.  Leukocytosis Likely reactive leukocytosis.  Patient with pyuria but with no dysuria.  Urine cultures pending.  Chest x-ray negative for any acute infiltrates.  Patient afebrile.  Hold off on antibiotics at this time.  Follow.  4.  Pyuria Patient asymptomatic.  Urinalysis with no leukocyte esterase, no nitrates.  Many bacteria. > WBC.  Urine cultures pending.  Hold off on antibiotics at this time.  Follow.   DVT prophylaxis: Lovenox Code Status: Full Family Communication: Updated patient.  No family at bedside. Disposition:   Status is: Inpatient    Dispo: The patient is from: Home              Anticipated d/c is to:  Home              Anticipated d/c date is: 1 to 2 days.              Patient currently on insulin drip being transitioned to subcutaneous insulin.  Currently n.p.o. being transitioned to a clear liquid diet.  Not stable for discharge.       Consultants:   None  Procedures:   Chest x-ray 07/10/2020    Antimicrobials:   None   Subjective: Sleeping but arousable.  Stated she feels better than on admission.  Patient mostly nodding and shaking her head to answer questions.  Denies any chest pain.  No shortness of breath.  No abdominal pain.  Nausea improved.  No emesis.  Objective: Vitals:   07/11/20 0500 07/11/20 0800 07/11/20 0810 07/11/20 0900  BP: (!) 161/97  (!) 142/82  (!) 157/91  Pulse:  (!) 104    Resp: (!) 21 19  13   Temp:   99.4 F (37.4 C)   TempSrc:   Oral   SpO2:  93%  93%  Weight:      Height:        Intake/Output Summary (Last 24 hours) at 07/11/2020 1021 Last data filed at 07/11/2020 0900 Gross per 24 hour  Intake 2003.15 ml  Output --  Net 2003.15 ml   Filed Weights   07/10/20 1524 07/11/20 0000  Weight: 72 kg 68 kg    Examination:  General exam: Appears calm and comfortable  Respiratory system: Clear to auscultation. Respiratory effort normal. Cardiovascular system: S1 & S2 heard, RRR. No JVD, murmurs, rubs, gallops or clicks. No pedal edema. Gastrointestinal system: Abdomen is nondistended, soft and nontender. No organomegaly or masses felt. Normal bowel sounds heard. Central nervous system: Alert and oriented. No focal neurological deficits. Extremities: Symmetric 5 x 5 power. Skin: No rashes, lesions or ulcers Psychiatry: Judgement and insight appear normal. Mood & affect appropriate.     Data Reviewed: I have personally reviewed following labs and imaging studies  CBC: Recent Labs  Lab 07/09/20 2334 07/10/20 1540 07/11/20 0049  WBC 16.4* 20.9* 16.3*  NEUTROABS  --  17.5*  --   HGB 16.5* 17.4* 14.7  HCT 46.3* 47.4* 41.2  MCV 89.0 87.0 91.4  PLT 581* 501* 387    Basic Metabolic Panel: Recent Labs  Lab 07/09/20 2334 07/10/20 1540 07/11/20 0049 07/11/20 0614  NA 139 143 139 141  K 3.6 3.4* 3.0* 3.1*  CL 100 100 100 101  CO2 17* 23 25 28   GLUCOSE 230* 72 154* 163*  BUN 26* 38* 32* 27*  CREATININE 1.25* 1.07* 0.88 0.77  CALCIUM 10.0 10.0 9.0 9.1  MG  --   --   --  1.8    GFR: Estimated Creatinine Clearance: 103.7 mL/min (by C-G formula based on SCr of 0.77 mg/dL).  Liver Function Tests: Recent Labs  Lab 07/10/20 1540  AST 28  ALT 22  ALKPHOS 72  BILITOT 1.3*  PROT 9.2*  ALBUMIN 5.1*    CBG: Recent Labs  Lab 07/11/20 0550 07/11/20 0654 07/11/20 0746 07/11/20 0846  07/11/20 0951  GLUCAP 153* 130* 164* 184* 153*     Recent Results (from the past 240 hour(s))  Respiratory Panel by RT PCR (Flu A&B, Covid) - Nasopharyngeal Swab     Status: None   Collection Time: 07/10/20  9:53 PM   Specimen: Nasopharyngeal Swab  Result Value Ref Range Status   SARS Coronavirus 2 by RT PCR  NEGATIVE NEGATIVE Final    Comment: (NOTE) SARS-CoV-2 target nucleic acids are NOT DETECTED.  The SARS-CoV-2 RNA is generally detectable in upper respiratoy specimens during the acute phase of infection. The lowest concentration of SARS-CoV-2 viral copies this assay can detect is 131 copies/mL. A negative result does not preclude SARS-Cov-2 infection and should not be used as the sole basis for treatment or other patient management decisions. A negative result may occur with  improper specimen collection/handling, submission of specimen other than nasopharyngeal swab, presence of viral mutation(s) within the areas targeted by this assay, and inadequate number of viral copies (<131 copies/mL). A negative result must be combined with clinical observations, patient history, and epidemiological information. The expected result is Negative.  Fact Sheet for Patients:  https://www.moore.com/https://www.fda.gov/media/142436/download  Fact Sheet for Healthcare Providers:  https://www.young.biz/https://www.fda.gov/media/142435/download  This test is no t yet approved or cleared by the [place] FDA and  has been authorized for detection and/or diagnosis of SARS-CoV-2 by FDA under an Emergency Use Authorization (EUA). This EUA will remain  in effect (meaning this test can be used) for the duration of the COVID-19 declaration under Section 564(b)(1) of the Act, 21 U.S.C. section 360bbb-3(b)(1), unless the authorization is terminated or revoked sooner.     Influenza A by PCR NEGATIVE NEGATIVE Final   Influenza B by PCR NEGATIVE NEGATIVE Final    Comment: (NOTE) The Xpert Xpress SARS-CoV-2/FLU/RSV assay is intended as  an aid in  the diagnosis of influenza from Nasopharyngeal swab specimens and  should not be used as a sole basis for treatment. Nasal washings and  aspirates are unacceptable for Xpert Xpress SARS-CoV-2/FLU/RSV  testing.  Fact Sheet for Patients: https://www.moore.com/https://www.fda.gov/media/142436/download  Fact Sheet for Healthcare Providers: https://www.young.biz/https://www.fda.gov/media/142435/download  This test is not yet approved or cleared by the [place] FDA and  has been authorized for detection and/or diagnosis of SARS-CoV-2 by  FDA under an Emergency Use Authorization (EUA). This EUA will remain  in effect (meaning this test can be used) for the duration of the  Covid-19 declaration under Section 564(b)(1) of the Act, 21  U.S.C. section 360bbb-3(b)(1), unless the authorization is  terminated or revoked. Performed at [facility], [address] [place], [place] [address]   MRSA PCR Screening     Status: None   Collection Time: 07/11/20  1:02 AM   Specimen: Nasopharyngeal  Result Value Ref Range Status   MRSA by PCR NEGATIVE NEGATIVE Final    Comment:        The GeneXpert MRSA Assay (FDA approved for NASAL specimens only), is one component of a comprehensive MRSA colonization surveillance program. It is not intended to diagnose MRSA infection nor to guide or monitor treatment for MRSA infections. Performed at [facility], [address] [place], [place] [address]          Radiology Studies: [facility]  Result Date: 07/10/2020 CLINICAL DATA:  Leukocytosis EXAM: PORTABLE CHEST 1 VIEW COMPARISON:  None. FINDINGS: The heart size and mediastinal contours are within normal limits. Both lungs are clear. The visualized skeletal structures are unremarkable. IMPRESSION: No active disease. Electronically Signed   By: Jasmine PangKim  Fujinaga M.D.   On: 07/10/2020 23:25        Scheduled Meds: . Chlorhexidine Gluconate Cloth  6 each Topical Daily  .  enoxaparin (LOVENOX) injection  40 mg Subcutaneous Q24H  . insulin aspart  0-15 Units Subcutaneous TID WC  . insulin glargine  38 Units Subcutaneous Daily  .  potassium chloride  40 mEq Oral Q4H   Continuous Infusions: . dextrose 5% lactated ringers 125 mL/hr at 07/11/20 0907  . insulin 2.4 mL/hr at 07/11/20 0900  . lactated ringers    . sodium chloride       LOS: 1 day    Time spent: 40 minutes    Ramiro Harvest, MD [facility]   To contact the attending provider between 7A-7P or the covering provider during after hours 7P-7A, please log into the web site [email or web address] and access using universal [place] password for that web site. If you do not have the password, please call the hospital operator.  07/11/2020, 10:21 AM

## 2020-07-11 NOTE — Progress Notes (Signed)
Inpatient Diabetes Program Recommendations  AACE/ADA: New Consensus Statement on Inpatient Glycemic Control (2015)  Target Ranges:  Prepandial:   less than 140 mg/dL      Peak postprandial:   less than 180 mg/dL (1-2 hours)      Critically ill patients:  140 - 180 mg/dL   Lab Results  Component Value Date   GLUCAP 184 (H) 07/11/2020    Review of Glycemic Control  Diabetes history: DM1 Outpatient Diabetes medications: Lantus 38 units qd + Novolog meal coverage (will clarify with patient amount per serving of carbohydrates) Current orders for Inpatient glycemic control: IV insulin transitioning to Lantus 38 units + Novolog moderate correction tid  Inpatient Diabetes Program Recommendations:   Noted pending A1c. A1c 12/05/19 was 12.4 @ [employer]. Will need meal coverage when eating or taking in @ least 50% meals + Novolog hs correction 0-5.  Will plan to see pt. Today.  Thank you, Billy Fischer. Raul Winterhalter, RN, MSN, CDE  Diabetes Coordinator Inpatient Glycemic Control Team Team Pager [phone] (8am-5pm) 07/11/2020 9:07 AM

## 2020-07-11 NOTE — Progress Notes (Signed)
Patient transported upstairs to room 1606.

## 2020-07-12 LAB — URINE CULTURE: Culture: <10000 — AB

## 2020-07-12 LAB — BASIC METABOLIC PANEL
Anion gap: 8 (ref 5–15)
BUN: 8 mg/dL (ref 6–20)
CO2: 23 mmol/L (ref 22–32)
Calcium: 8.3 mg/dL — ABNORMAL LOW (ref 8.9–10.3)
Chloride: 109 mmol/L (ref 98–111)
Creatinine, Ser: 0.52 mg/dL (ref 0.44–1.00)
GFR, Estimated: >60 mL/min (ref >60)
Glucose, Bld: 114 mg/dL — ABNORMAL HIGH (ref 70–99)
Potassium: 4 mmol/L (ref 3.5–5.1)
Sodium: 140 mmol/L (ref 135–145)

## 2020-07-12 LAB — CBC WITH DIFFERENTIAL/PLATELET
Abs Immature Granulocytes: 0.08 10*3/uL — ABNORMAL HIGH (ref 0.00–0.07)
Basophils Absolute: 0.1 10*3/uL (ref 0.0–0.1)
Basophils Relative: 1 %
Eosinophils Absolute: 0.1 10*3/uL (ref 0.0–0.5)
Eosinophils Relative: 1 %
HCT: 36.6 % (ref 36.0–46.0)
Hemoglobin: 13.2 g/dL (ref 12.0–15.0)
Immature Granulocytes: 1 %
Lymphocytes Relative: 36 %
Lymphs Abs: 2.7 10*3/uL (ref 0.7–4.0)
MCH: 32.1 pg (ref 26.0–34.0)
MCHC: 36.1 g/dL — ABNORMAL HIGH (ref 30.0–36.0)
MCV: 89.1 fL (ref 80.0–100.0)
Monocytes Absolute: 0.7 10*3/uL (ref 0.1–1.0)
Monocytes Relative: 10 %
Neutro Abs: 3.8 10*3/uL (ref 1.7–7.7)
Neutrophils Relative %: 51 %
Platelets: 261 10*3/uL (ref 150–400)
RBC: 4.11 MIL/uL (ref 3.87–5.11)
RDW: 12 % (ref 11.5–15.5)
WBC: 7.4 10*3/uL (ref 4.0–10.5)
nRBC: 0 % (ref 0.0–0.2)

## 2020-07-12 LAB — GLUCOSE, CAPILLARY
Glucose-Capillary: 137 mg/dL — ABNORMAL HIGH (ref 70–99)
Glucose-Capillary: 206 mg/dL — ABNORMAL HIGH (ref 70–99)
Glucose-Capillary: 159 mg/dL — ABNORMAL HIGH (ref 70–99)

## 2020-07-12 LAB — HEMOGLOBIN A1C
Hgb A1c MFr Bld: 11.1 % — ABNORMAL HIGH (ref 4.8–5.6)
Mean Plasma Glucose: 272 mg/dL

## 2020-07-12 LAB — MAGNESIUM: Magnesium: 2 mg/dL (ref 1.7–2.4)

## 2020-07-12 MED ORDER — BLOOD GLUC METER DISP-STRIPS DEVI: 0.0000 [IU] | Freq: Every day | 0 refills | Status: DC

## 2020-07-12 MED ORDER — LANCETS MISC. MISC: 0.0000 [IU] | Freq: Three times a day (TID) | 0 refills | Status: DC

## 2020-07-12 NOTE — Discharge Summary (Signed)
Physician Discharge Summary  Emily Acevedo CVE:[id number] DOB: Aug 22, 1991 DOA: 07/10/2020  PCP: Ladora Danyetta Gillham, PA-C  Admit date: 07/10/2020 Discharge date: 07/12/2020  Time spent: 50 minutes  Recommendations for Outpatient Follow-up:  1. Follow-up with Ladora Deshon Koslowski, PA-C in 2 weeks.  On follow-up patient need a basic metabolic profile done to follow-up on electrolytes and renal function. 2. Follow-up with [facility] endocrinology as previously scheduled July 17, 2020 per patient.   Discharge Diagnoses:  Principal Problem:   DKA, type 1 (HCC) Active Problems:   Leukocytosis   Pyuria   Hypokalemia   Discharge Condition: Stable and improved.  Diet recommendation: Carb modified diet.  Filed Weights   07/10/20 1524 07/11/20 0000 07/11/20 2114  Weight: 72 kg 68 kg 74.1 kg    History of present illness:  HPI per Dr. Hazle Coca is a 29 y.o. female with medical history significant of DM1, hysterectomy.  Pt presents to the ED via EMS with concern for DKA.  Pt had been out of diabetic test strips for past 1 year, not monitoring BGLs at home.  Over past 3 days she has been feeling unwell with nausea and SOB.  Feels like DKA.  Treating symptoms with 5-10u of insulin per hour for the past 2 days she says.  Administered self 15u just prior to calling EMS this afternoon.  On EMS arrival CBG 160, was 126 on recheck in ambulance.  Usually takes 1u per 5 carbs.  Has endocrinologist first appointment next month.  Denies fevers, chills, dysuria, foot ulcers, abd pain.   ED Course: Pt does have mild DKA with AG of 20 and 80 keytones in urine.  PH not acidotic however.  Bicarb 23.  Has WBC 20.9k.  Has very mild AKI / pre-renal azotemia with BUN 38 and creat 1.0.  HGB 17.4 (suspect hemoconcentration).  K 3.4.  BGL 72.  UA with 80 keytones, >50 WBC, no LE nor nitrites.  Hospital Course:  1 DKA, type I diabetic Patient had presented with nausea, some  shortness of breath, noted to be in DKA with urinalysis with 80 ketones, no leukocyte esterase or nitrites, many bacteria, greater than 50 WBC.  Noted to have an anion gap of 20.  Chest x-ray negative for any acute infectious process.  Patient noted to have a leukocytosis however trended down and had resolved by day of discharge.  EKG with no ischemic changes noted.  Hemoglobin A1c 11.1 (07/11/2020 ).  Patient noted not to be checking her blood glucose levels however did state she was compliant with her medications.  Patient placed on the Endo tool/glucose stabilizer with clinical improvement.  Anion gap closed.  Bicarb 28.  Beta hydroxybutyrate trending down.  Patient was transitioned from insulin drip back to home regimen of Lantus 38 units daily.  Patient placed on a sliding scale insulin.  Patient started on clear liquid diet and diet advanced to a carb modified diet which she tolerated.  Patient improved clinically and will be discharged home in stable and improved condition.  Outpatient follow-up with PCP in 2 weeks.  Patient states has a outpatient follow-up with labile endocrinology on 07/17/2020 and patient urged to keep outpatient follow-up.  Prescriptions was written for test strips and lancets on discharge.   2.  Hypokalemia Secondary to problem #1. Repleted by day of discharge.  Outpatient follow-up.    3.  Leukocytosis Likely reactive leukocytosis.  Patient with pyuria but with no dysuria.  Urine cultures with insignificant growth.  Chest x-ray negative for any acute infiltrates.  Patient remained afebrile.  Leukocytosis resolved by day of discharge.  Outpatient follow-up.   4.  Pyuria Patient asymptomatic.  Urinalysis with no leukocyte esterase, no nitrates.  Many bacteria. > WBC.  Urine cultures with insignificant growth.  Outpatient follow-up.     Procedures:  Chest x-ray 07/10/2020    Consultations:  None  Discharge Exam: Vitals:   07/12/20 0540 07/12/20 0922  BP:  125/85 (!) 145/86  Pulse: 82 94  Resp: 14   Temp: 98.6 F (37 C) 98.4 F (36.9 C)  SpO2: 96% 97%    General: NAD Cardiovascular: RRR Respiratory: CTAB  Discharge Instructions   Discharge Instructions    Diet Carb Modified   Complete by: As directed    Increase activity slowly   Complete by: As directed      Allergies as of 07/12/2020      Reactions   Other    Pt states allergy to base metals, sever rash   Tape    Pt states "it tears my skin open"   Nickel Rash      Medication List    TAKE these medications   BLOOD GLUCOSE METER DISPOSABLE Devi 0-10 Units by Does not apply route daily.   gabapentin 300 MG capsule Commonly known as: NEURONTIN Take 300 mg by mouth daily.   insulin aspart 100 UNIT/ML FlexPen Commonly known as: NOVOLOG Inject 1 Units into the skin See admin instructions. Inject 1 unit per every carb intake.   Lancets Misc. Misc 0-10 Units by Does not apply route 3 (three) times daily before meals.   Lantus SoloStar 100 UNIT/ML Solostar Pen Generic drug: insulin glargine Inject 38 Units into the skin daily.   ondansetron 4 MG disintegrating tablet Commonly known as: ZOFRAN-ODT Take 4 mg by mouth every 8 (eight) hours as needed for nausea or vomiting.      Allergies  Allergen Reactions  . Other     Pt states allergy to base metals, sever rash  . Tape     Pt states "it tears my skin open"  . Nickel Rash    Follow-up Information    Ladora Tisheena Maguire, [place]. Schedule an appointment as soon as possible for a visit in 2 week(s).   Specialty: Physician Assistant Contact information: [address] [place] [address] [phone]        [facility] Follow up on 07/17/2020.   Specialty: Internal Medicine Why: f/u as scheduled. Contact information: [address] [place] [address] [phone]               The results of significant diagnostics from this hospitalization (including  imaging, microbiology, ancillary and laboratory) are listed below for reference.    Significant Diagnostic Studies: DG Chest Port 1 View  Result Date: 07/10/2020 CLINICAL DATA:  Leukocytosis EXAM: PORTABLE CHEST 1 VIEW COMPARISON:  None. FINDINGS: The heart size and mediastinal contours are within normal limits. Both lungs are clear. The visualized skeletal structures are unremarkable. IMPRESSION: No active disease. Electronically Signed   By: Jasmine Pang M.D.   On: 07/10/2020 23:25    Microbiology: Recent Results (from the past 240 hour(s))  Respiratory Panel by RT PCR (Flu A&B, Covid) - Nasopharyngeal Swab     Status: None   Collection Time: 07/10/20  9:53 PM   Specimen: Nasopharyngeal Swab  Result Value Ref Range Status   SARS Coronavirus 2 by RT PCR NEGATIVE NEGATIVE Final  Comment: (NOTE) SARS-CoV-2 target nucleic acids are NOT DETECTED.  The SARS-CoV-2 RNA is generally detectable in upper respiratoy specimens during the acute phase of infection. The lowest concentration of SARS-CoV-2 viral copies this assay can detect is 131 copies/mL. A negative result does not preclude SARS-Cov-2 infection and should not be used as the sole basis for treatment or other patient management decisions. A negative result may occur with  improper specimen collection/handling, submission of specimen other than nasopharyngeal swab, presence of viral mutation(s) within the areas targeted by this assay, and inadequate number of viral copies (<131 copies/mL). A negative result must be combined with clinical observations, patient history, and epidemiological information. The expected result is Negative.  Fact Sheet for Patients:  https://www.moore.com/https://www.fda.gov/media/142436/download  Fact Sheet for Healthcare Providers:  https://www.young.biz/https://www.fda.gov/media/142435/download  This test is no t yet approved or cleared by the [place] FDA and  has been authorized for detection and/or diagnosis of SARS-CoV-2 by FDA  under an Emergency Use Authorization (EUA). This EUA will remain  in effect (meaning this test can be used) for the duration of the COVID-19 declaration under Section 564(b)(1) of the Act, 21 U.S.C. section 360bbb-3(b)(1), unless the authorization is terminated or revoked sooner.     Influenza A by PCR NEGATIVE NEGATIVE Final   Influenza B by PCR NEGATIVE NEGATIVE Final    Comment: (NOTE) The Xpert Xpress SARS-CoV-2/FLU/RSV assay is intended as an aid in  the diagnosis of influenza from Nasopharyngeal swab specimens and  should not be used as a sole basis for treatment. Nasal washings and  aspirates are unacceptable for Xpert Xpress SARS-CoV-2/FLU/RSV  testing.  Fact Sheet for Patients: https://www.moore.com/https://www.fda.gov/media/142436/download  Fact Sheet for Healthcare Providers: https://www.young.biz/https://www.fda.gov/media/142435/download  This test is not yet approved or cleared by the [place] FDA and  has been authorized for detection and/or diagnosis of SARS-CoV-2 by  FDA under an Emergency Use Authorization (EUA). This EUA will remain  in effect (meaning this test can be used) for the duration of the  Covid-19 declaration under Section 564(b)(1) of the Act, 21  U.S.C. section 360bbb-3(b)(1), unless the authorization is  terminated or revoked. Performed at [facility], [address] [place], [place] [address]   Culture, Urine     Status: Abnormal   Collection Time: 07/10/20 11:37 PM   Specimen: Urine, Clean Catch  Result Value Ref Range Status   Specimen Description   Final    URINE, CLEAN CATCH Performed at [facility], [address] [place], [place] [address]    Special Requests   Final    NONE Performed at [facility], [address] [place], [place] [address]    Culture (A)  Final    <10,000 COLONIES/mL INSIGNIFICANT GROWTH Performed at [facility], [address] [place], [place] [address]    Report Status  07/12/2020 FINAL  Final  MRSA PCR Screening     Status: None   Collection Time: 07/11/20  1:02 AM   Specimen: Nasopharyngeal  Result Value Ref Range Status   MRSA by PCR NEGATIVE NEGATIVE Final    Comment:        The GeneXpert MRSA Assay (FDA approved for NASAL specimens only), is one component of a comprehensive MRSA colonization surveillance program. It is not intended to diagnose MRSA infection nor to guide or monitor treatment for MRSA infections. Performed at [facility], [address] [place], [place] [address]      Labs: Basic Metabolic Panel:  Recent Labs  Lab 07/11/20 0049 07/11/20 0614 07/11/20 1038 07/11/20 1537 07/12/20 0608  NA 139 141 135 140 140  K 3.0* 3.1* 2.9* 3.4* 4.0  CL 100 101 99 106 109  CO2 25 28 26 26 23   GLUCOSE 154* 163* 155* 64* 114*  BUN 32* 27* 21* 16 8  CREATININE 0.88 0.77 0.71 0.59 0.52  CALCIUM 9.0 9.1 8.6* 8.3* 8.3*  MG  --  1.8  --   --  2.0   Liver Function Tests: Recent Labs  Lab 07/10/20 1540  AST 28  ALT 22  ALKPHOS 72  BILITOT 1.3*  PROT 9.2*  ALBUMIN 5.1*   No results for input(s): LIPASE, AMYLASE in the last 168 hours. No results for input(s): AMMONIA in the last 168 hours. CBC: Recent Labs  Lab 07/09/20 2334 07/10/20 1540 07/11/20 0049 07/12/20 0608  WBC 16.4* 20.9* 16.3* 7.4  NEUTROABS  --  17.5*  --  3.8  HGB 16.5* 17.4* 14.7 13.2  HCT 46.3* 47.4* 41.2 36.6  MCV 89.0 87.0 91.4 89.1  PLT 581* 501* 387 261   Cardiac Enzymes: No results for input(s): CKTOTAL, CKMB, CKMBINDEX, TROPONINI in the last 168 hours. BNP: BNP (last 3 results) No results for input(s): BNP in the last 8760 hours.  ProBNP (last 3 results) No results for input(s): PROBNP in the last 8760 hours.  CBG: Recent Labs  Lab 07/11/20 1635 07/11/20 1658 07/11/20 2216 07/12/20 0744 07/12/20 1141  GLUCAP 62* 94 123* 137* 206*       Signed:  07/14/20 MD.  [facility] 07/12/2020, 12:49  PM

## 2020-07-12 NOTE — Progress Notes (Signed)
Patient discharged to home, discharge instructions reviewed with patient who verbalized understanding.  

## 2020-07-16 ENCOUNTER — Ambulatory Visit (INDEPENDENT_AMBULATORY_CARE_PROVIDER_SITE_OTHER): Payer: Medicaid Other | Admitting: Internal Medicine

## 2020-07-16 ENCOUNTER — Other Ambulatory Visit: Payer: Self-pay

## 2020-07-16 ENCOUNTER — Encounter: Payer: Self-pay | Admitting: Internal Medicine

## 2020-07-16 VITALS — BP 138/82 | HR 94 | Ht 68.0 in | Wt 165.6 lb

## 2020-07-16 DIAGNOSIS — E1040 Type 1 diabetes mellitus with diabetic neuropathy, unspecified: Secondary | ICD-10-CM | POA: Diagnosis not present

## 2020-07-16 DIAGNOSIS — E1065 Type 1 diabetes mellitus with hyperglycemia: Secondary | ICD-10-CM | POA: Diagnosis not present

## 2020-07-16 MED ORDER — FREESTYLE LITE TEST VI STRP: ORAL_STRIP | 12 refills | Status: AC

## 2020-07-16 MED ORDER — FREESTYLE LANCETS MISC: 12 refills | Status: AC

## 2020-07-16 MED ORDER — LANTUS SOLOSTAR 100 UNIT/ML ~~LOC~~ SOPN
38.0000 [IU] | PEN_INJECTOR | Freq: Every day | SUBCUTANEOUS | 1 refills | Status: DC
Start: 2020-07-16 — End: 2021-01-12

## 2020-07-16 MED ORDER — INSULIN ASPART 100 UNIT/ML FLEXPEN: PEN_INJECTOR | SUBCUTANEOUS | 1 refills | Status: AC

## 2020-07-16 MED ORDER — FREESTYLE LITE DEVI: 0 refills | Status: AC

## 2020-07-16 MED ORDER — LANCETS MISC. MISC: 3 refills | Status: AC

## 2020-07-16 NOTE — Patient Instructions (Addendum)
Please start checking blood sugars consistently.  Please continue: - Lantus 38 units at bedtime - Aspart - ICR 1:5, ISF 1:30, target 120  Please come back for a follow-up appointment in 2.5 months.  Basic Rules for Patients with Type I Diabetes Mellitus  1. The American Diabetes Association (ADA) recommended targets: - fasting sugar 80-130 - after meal sugar <180 - HbA1C <7%  2. Engage in ?150 min moderate exercise per week  3. Make sure you have ?8h of sleep every night as this helps both blood sugars and your weight.  4. Always keep a sugar log (not only record in your meter) and bring it to all appointments with [place].  5. "15-15 rule" for hypoglycemia: if sugars are low, take 15 g of carbs** ("fast sugar" - e.g. 4 glucose tablets, 4 oz orange juice), wait 15 min, then check sugars again. If still <80, repeat. Continue  until your sugars >80, then eat a normal meal.   6. Teach family members and coworkers to inject glucagon. Have a glucagon set at home and one at work. They should call 911 after using the set.  7. Check sugar before driving. If <100, correct, and only start driving if sugars rise ?102. Check sugar every hour when on a long drive.  8. Check sugar before exercising. If <100, correct, and only start exercising if sugars rise ?100. Check sugar every hour when on a long exercise routine and 1h after you finished exercising.   If >250, check urine for ketones. If you have moderate-large ketones in urine, do not start exercise. Hydrate yourself with clear liquids and correct the high sugar. Recheck sugars and ketones before attempting to exercise.  Be aware that you might need less insulin when exercising.  *intense, short, exercise bursts can increase your sugars, but  *less intense, longer (>1h), exercise routines can decrease your sugars.   9. Make sure you have a MedAlert bracelet or pendant mentioning "Type I Diabetes Mellitus". If you have a prior episode of severe  hypoglycemia or hypoglycemia unawareness, it should also mention this.  10. Please do not walk barefoot. Inspect your feet for sores/cuts and let us know if you have them.  **E.g. of "fast carbs": ? first choice (15 g):  1 tube glucose gel, GlucoPouch 15, 2 oz glucose liquid ? second choice (15-16 g):  3 or 4 glucose tablets (best taken  with water), 15 Dextrose Bits chewable ? third choice (15-20 g):   cup fruit juice,  cup regular soda, 1 cup skim milk,  1 cup sports drink ? fourth choice (15-20 g):  1 small tube Cakemate gel (not frosting), 2 tbsp raisins, 1 tbsp table sugar,  candy, jelly beans, gum drops - check package for carb amount   (adapted from: Juluis Rainier. "Insulin therapy and hypoglycemia" Endocrinol Metab Clin N Am 2012, 41: 57-87)  Sick Day Rules for Diabetes  Think S-K-I-L-L:  Sugars:  - if glucose >200, check every 3h and drink sugar free liquids  - if glucose <200, drink carb-containing liquids and recheck 30 min later  - if glucose high, correct with insulin  - if sugars <60, initiate hypoglycemia management (take 15 g of fast carbs and check sugars in 15 min  - repeat until sugars remain >100).  Ketones:  When to check ketones?  When glucose >300 x2 if on insulin injections (>300 x 1 if on insulin pump). When nausea, vomiting, diarrhea, abdominal pain, headache, fever - even if glucose is normal  or low - because in this case, you need both glucose and insulin.    - if you have ketone strips for blood >> if ketones are more or equal than 0.6, need to increase insulin - if you have ketone strips for urine >> if ketones are more or equal than "small", need to increase insulin  Insulin: Never skip long acting insulin, even if not eating!   Urine ketones Blood ketones Extra insulin?  no <0.6 no  small 0.6-1.5 Increase dose by 5%  moderate 1.5-3 Increase dose by 10%  large >3 Increase dose by at least 20%   Liquids: - if glucose >200, check every  3h and drink sugar free liquids  - if glucose <200, small sips of carb-containing liquids (e.g. Ginger ale, Gatorade, juice, etc.)  Let us know!   Call us if: Go to ED if: Call your primary care doctor if:  Sugars >300 for >8h Severe abdominal pain Fever >100F for 24h  Moderate to large  urine ketones or blood ketones >1.5 Difficulty breathing Other chronic diseases flaring up  Vomiting and unable to keep liquids down Signs of dehydration

## 2020-07-16 NOTE — Progress Notes (Addendum)
Patient ID: Genice Rouge, female   DOB: June 21, 1991, 29 y.o.   MRN: [id number]   This visit occurred during the SARS-CoV-2 public health emergency.  Safety protocols were in place, including screening questions prior to the visit, additional usage of staff PPE, and extensive cleaning of exam room while observing appropriate contact time as indicated for disinfecting solutions.   HPI: DESHAE DICKISON is a 29 y.o.-year-old female, referred by the previous endocrinologist, Dr. Jaquita Rector, for management of DM1, diagnosed at age 41, uncontrolled, with complications (peripheral neuropathy, gastroparesis).  She moved here in [place], [place], in 09/2019.  Previously saw endocrinology: Dr. Jaquita Rector and [facility] in [place], [place].  She presented to the hospital last month in DKA after missing a Lantus dose.  Of note, she is not checking sugars at all.  Reviewed HbA1c levels: Lab Results  Component Value Date   HGBA1C 11.1 (H) 07/11/2020   Insulin pump:  -Previously on Medtronic 670 G  CGM: -Had the Medtronic Guardian CGM but did not like it  Insulin: -Aspart  Current regimen: -Lantus 38 units at bedtime -Aspart  - 10 min after meals - ICR 1:5, ISF 1:30, target 120 -she takes a maximum of 15 units of Humalog per day.  She only takes Humalog if she is having carbs with a meal.  Meter: none -  Prefers Freestyle.  Pt does not check CBGs - since moving to [place].   Lowest sugar was <10 as a teenager >> ? now;   she has hypoglycemia awareness at 90s.   She has a glucagon kit at home. No previous hypoglycemia admission. Had many lows when pregnant in 2015. Highest sugar was 241 She has had previous DKA admissions per review of records:   Mild DKA 07/10/2020 (anion gap 20) -before this, she missed her Lantus dose, she was not checking sugars at home as she did not have test strips. She had mild AKI inhouse and also hypokalemia - K 3.0 >> resolved 07/12/2020  DKA 08/19/2019 (insulin pump malfunction)  DKA  12/21/2018 + "countless episodes" of DKA.  Pt does not have set meals ("I do not eat regularly"): meat, cheese, veggies, fruit grains. For exercise, she is walking 2-3 times a week.  - no CKD, last BUN/creatinine:  Lab Results  Component Value Date   BUN 8 07/12/2020   BUN 16 07/11/2020   CREATININE 0.52 07/12/2020   CREATININE 0.59 07/11/2020   - last set of lipids: 01/04/2020: 163/86/53/94 No results found for: CHOL, HDL, LDLCALC, LDLDIRECT, TRIG, CHOLHDL   - last eye exam was in 06/2020: + DR. Groat.  - + numbness and tingling in her feet.  On gabapentin 300 mg daily.  Last TSH: No results found for: TSH  Pt has FH of DM in PGM - DM2.   She also has a history of GERD, anemia, asthma, borderline personality disorder-per review of chart.  She uses marijuana daily to help with her anxiety and depression.  ROS: Constitutional: no weight gain, no weight loss, + decreased appetite, + fatigue, no subjective hyperthermia, no subjective hypothermia, no nocturia Eyes: + blurry vision, no xerophthalmia ENT: no sore throat, no nodules palpated in neck, no dysphagia, no odynophagia, no hoarseness, + tinnitus, + hypoacusis Cardiovascular: no CP, no SOB, no palpitations, no leg swelling Respiratory: no cough, no SOB, no wheezing Gastrointestinal: + N, no V, no D, no C, no acid reflux Musculoskeletal: + Muscle, +  joint aches Skin: no rash, no hair loss  Neurological: + tremors,  no numbness or tingling/no dizziness/+ HAs Psychiatric: + depression, + anxiety + Low libido  Past Medical History:  Diagnosis Date  . Attention deficit   . Diabetes mellitus without complication [facility])    Past Surgical History:  Procedure Laterality Date  . ABDOMINAL HYSTERECTOMY     Has Left Ovary  . Neck Cyst Removed     Social History   Socioeconomic History  . Marital status: Single    Spouse name: Not on file  . Number of children: 1  . Years of education: Not on file  . Highest education  level: Not on file  Occupational History  . Occupation: Receptionist  Tobacco Use  . Smoking status: Current Every Day Smoker    Types: E-cigarettes  . Smokeless tobacco: Never Used  . Tobacco comment: Vapes  Vaping Use  . Vaping Use: Every day  Substance and Sexual Activity  . Alcohol use: Yes    Comment: Social  . Drug use: Yes    Frequency: 7.0 times per week    Types: Marijuana    Comment: For anxiety/depression  . Sexual activity: Not on file  Other Topics Concern  . Not on file  Social History Narrative   65-year-old daughter-in 06/2020   Social Determinants of Health   Financial Resource Strain:   . Difficulty of Paying Living Expenses: Not on file  Food Insecurity:   . Worried About Charity fundraiser in the Last Year: Not on file  . Ran Out of Food in the Last Year: Not on file  Transportation Needs:   . Lack of Transportation (Medical): Not on file  . Lack of Transportation (Non-Medical): Not on file  Physical Activity:   . Days of Exercise per Week: Not on file  . Minutes of Exercise per Session: Not on file  Stress:   . Feeling of Stress : Not on file  Social Connections:   . Frequency of Communication with Friends and Family: Not on file  . Frequency of Social Gatherings with Friends and Family: Not on file  . Attends Religious Services: Not on file  . Active Member of Clubs or Organizations: Not on file  . Attends Archivist Meetings: Not on file  . Marital Status: Not on file  Intimate Partner Violence:   . Fear of Current or Ex-Partner: Not on file  . Emotionally Abused: Not on file  . Physically Abused: Not on file  . Sexually Abused: Not on file   Current Outpatient Medications  Medication Sig Dispense Refill  . gabapentin (NEURONTIN) 300 MG capsule Take 300 mg by mouth daily.    . insulin aspart (NOVOLOG) 100 UNIT/ML FlexPen Inject up to 20 units a day under skin as discussed 45 mL 1  . insulin glargine (LANTUS SOLOSTAR) 100 UNIT/ML  Solostar Pen Inject 38 Units into the skin at bedtime. 45 mL 1  . Lancets Misc. MISC Use 3x a day as advised 300 each 3  . ondansetron (ZOFRAN-ODT) 4 MG disintegrating tablet Take 4 mg by mouth every 8 (eight) hours as needed for nausea or vomiting.     . Blood Glucose Monitoring Suppl (FREESTYLE LITE) DEVI Use to check blood sugar 3 times daily 1 each 0  . glucose blood (FREESTYLE LITE) test strip Use as instructed to check blood sugar 3 times daily 300 each 12  . Lancets (FREESTYLE) lancets Use as instructed to check blood sugar 3 times daily 300 each 12   No current facility-administered medications for  this visit.   Allergies  Allergen Reactions  . Other     Pt states allergy to base metals, sever rash  . Tape     Pt states "it tears my skin open"  . Nickel Rash   Family History  Problem Relation Age of Onset  . Hypertension Mother   . Cancer Maternal Grandmother   . Cancer Maternal Grandfather   . Diabetes Paternal Grandmother    PE: BP 138/82   Pulse 94   Ht 5' 8"  (1.727 m)   Wt 165 lb 9.6 oz (75.1 kg)   SpO2 96%   BMI 25.18 kg/m  Wt Readings from Last 3 Encounters:  07/16/20 165 lb 9.6 oz (75.1 kg)  07/11/20 163 lb 5.8 oz (74.1 kg)  07/10/20 160 lb 7.9 oz (72.8 kg)   Constitutional: normal weight, in NAD Eyes: PERRLA, EOMI, no exophthalmos ENT: moist mucous membranes, no thyromegaly, no cervical lymphadenopathy Cardiovascular: Tachycardia, RR, No MRG Respiratory: CTA B Gastrointestinal: abdomen soft, NT, ND, BS+ Musculoskeletal: no deformities, strength intact in all 4 Skin: moist, warm, no rashes Neurological: no tremor with outstretched hands, DTR normal in all 4  ASSESSMENT: 1. DM1, uncontrolled, with long-term complications -Peripheral neuropathy -Gastroparesis  PLAN:  1. Patient with long-standing, uncontrolled DM1.  She comes for the first appointment with me after hospitalization for DKA last month.  She mentions that she has not been checking blood  sugars at all after moving to [place] 10 months ago.  She would like a prescription for test strips but she does mention that she does not plan to check consistently, only when she feels poorly.  Also, she agrees for depression and anxiety, but would not want to see a counselor.  She tried this in the past but did not help her.  Also, she refuses medication for this but treats herself with marijuana daily.  She admits that her mental condition is poor.  I advised her that this can greatly impact her diabetes control.  However, she mentions that she feels that she has improved her diabetes and her HbA1c decreased from 12% below 10%.  When I advised her that this is still quite poor control and it could be improved, she mentions that she is actually happy that she feels she did a good job since she did not die from diabetes despite "countless" admissions for DKA and also history of severe hypoglycemia. -She tells me that she takes her Humalog after meals, which would be acceptable especially in the setting of gastroparesis.  However, she smokes marijuana daily and we discussed about the fact that this can cause nausea and gastroparesis like picture.  However, she would like to continue with this practice.  -She does not have consistent meals and would not want to change this.  I did advise her that usually the total dose of rapid acting insulin per day has to be closer to the total dose of long-acting insulin, but she mentions that this has not worked for her in the past.  She continues to use only few units of Humalog, or certain meals only. -Overall, patient is not ready to make any changes in her regimen, despite the very poor diabetes control.  She actually tells me that she is a noncompliant patient and she is only here for refills.  I explained that I am not willing to only write prescriptions for her without seeing her and working with her towards a better diabetes control.  I advised her that  if she  is not willing to allow [place] to work together towards better diabetes control, I would not be able to see her. -For now, I did refill her insulin prescriptions along with prescriptions for glucometer, test strips, and lancets for the next 6 months. - I suggested to: Patient Instructions   Please start checking blood sugars consistently.  Please continue: - Lantus 38 units at bedtime - Aspart - ICR 1:5, ISF 1:30, target 120  Please come back for a follow-up appointment in 2.5 months.  - advised her to start checking sugars at different times of the day - check 3-4 times a day, rotating checks - discussed about target CBGs and HbA1c for diabetes control - given foot care handout and explained the principles  - given instructions for hypoglycemia management "15-15 rule"  - she has yearly eye exams  - advised to get ketone strips - advised to always have Glu tablets with her - given instruction Re: exercising and driving in DM1 (pt instructions) - also, given information about sick day rules - no signs of other autoimmune disorders - Return to clinic in 3 months   Philemon Kingdom, MD PhD [facility]

## 2021-01-11 ENCOUNTER — Other Ambulatory Visit: Payer: Self-pay | Admitting: Internal Medicine

## 2021-05-15 IMAGING — DX DG CHEST 1V PORT
1 series · 1 of 1 positions shown · non-contrast
Comparison: None.

CLINICAL DATA: Leukocytosis

EXAM:
PORTABLE CHEST 1 VIEW

[chest ap]
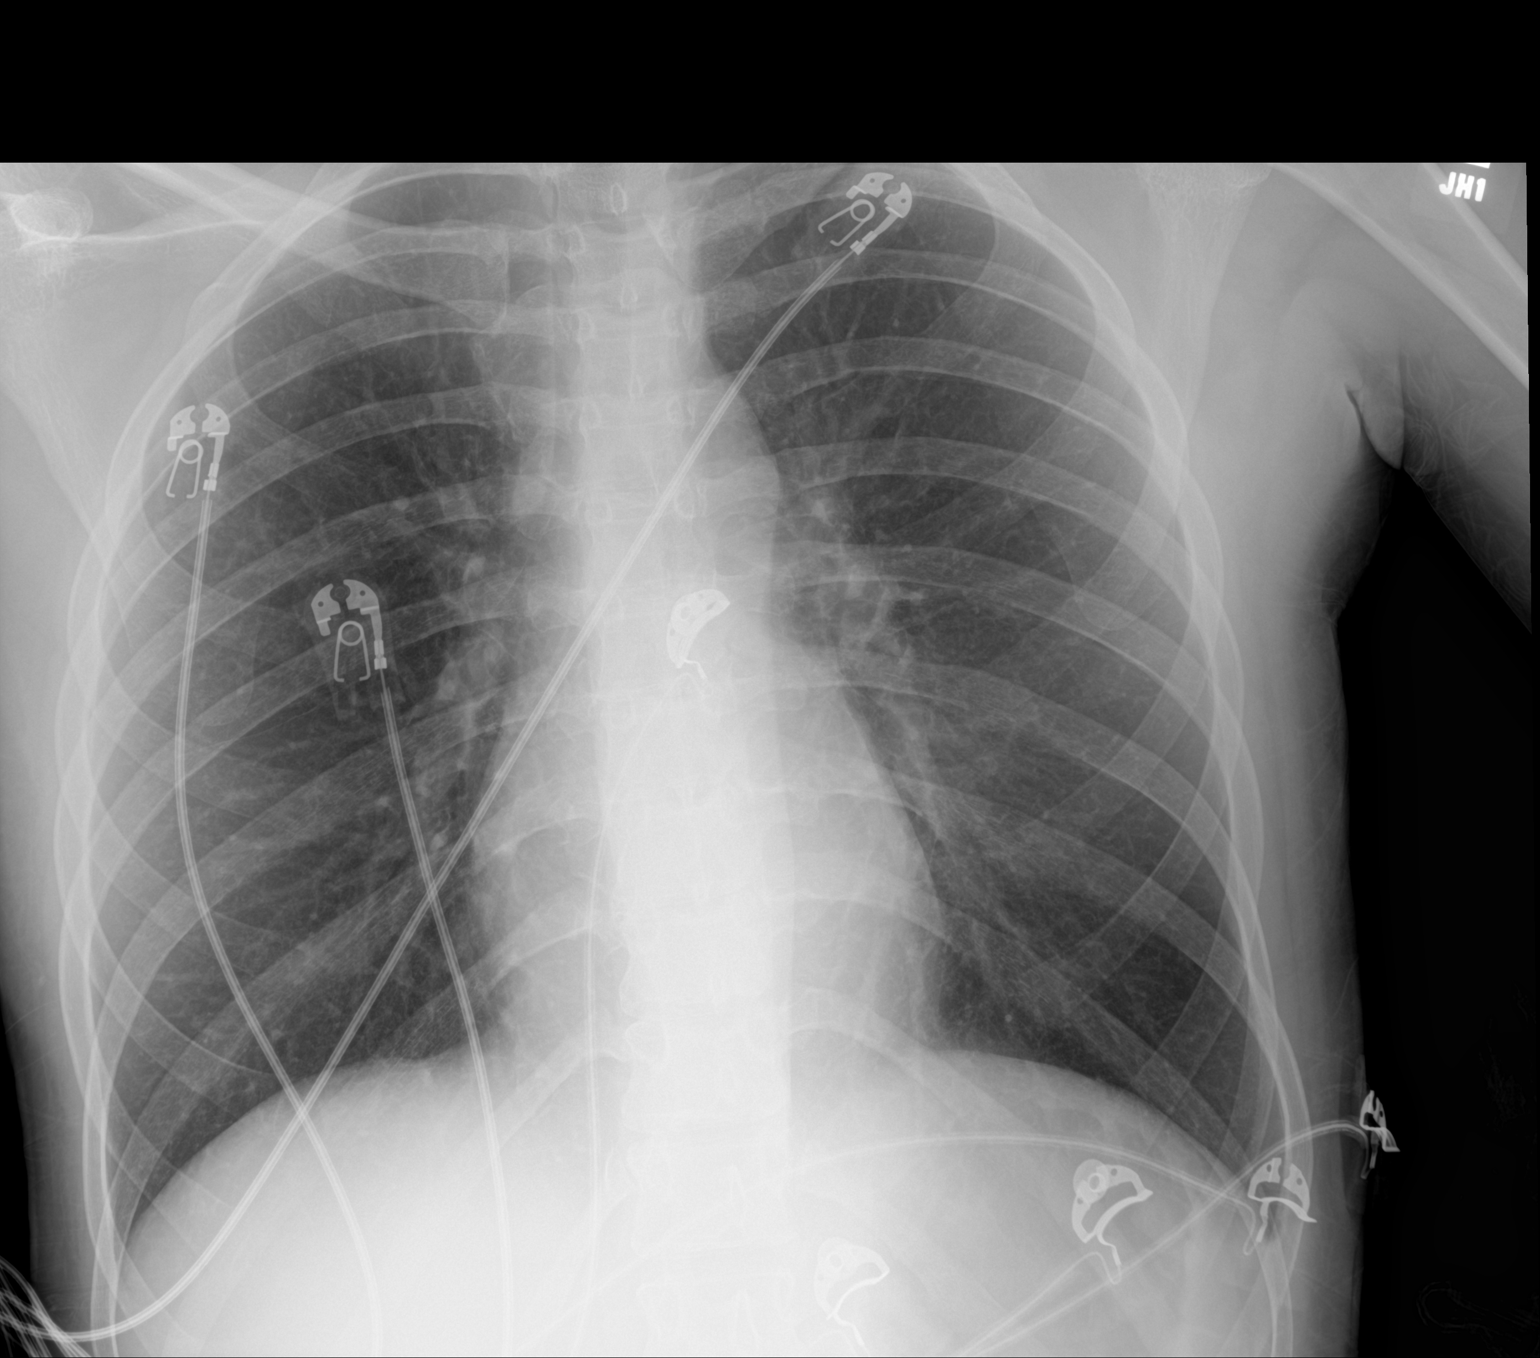

[1 of 1 positions shown; findings below may reference images not displayed]

FINDINGS: The heart size and mediastinal contours are within normal limits.
Both lungs are clear. The visualized skeletal structures are
unremarkable.
IMPRESSION: No active disease.
